# Patient Record
Sex: Female | Born: 1950 | Race: White | Hispanic: No | Marital: Married | Smoking: Never smoker
Health system: Southern US, Community
[De-identification: ages and names within clinical notes are randomized; demographics above are authoritative.]

## PROBLEM LIST (undated history)

## (undated) DIAGNOSIS — R911 Solitary pulmonary nodule: Secondary | ICD-10-CM

## (undated) DIAGNOSIS — E7800 Pure hypercholesterolemia, unspecified: Secondary | ICD-10-CM

## (undated) DIAGNOSIS — T148XXA Other injury of unspecified body region, initial encounter: Secondary | ICD-10-CM

## (undated) DIAGNOSIS — H269 Unspecified cataract: Secondary | ICD-10-CM

## (undated) DIAGNOSIS — M5030 Other cervical disc degeneration, unspecified cervical region: Secondary | ICD-10-CM

## (undated) DIAGNOSIS — Z860100 Personal history of colon polyps, unspecified: Secondary | ICD-10-CM

## (undated) DIAGNOSIS — K2970 Gastritis, unspecified, without bleeding: Secondary | ICD-10-CM

## (undated) DIAGNOSIS — M1990 Unspecified osteoarthritis, unspecified site: Secondary | ICD-10-CM

## (undated) DIAGNOSIS — N816 Rectocele: Secondary | ICD-10-CM

## (undated) DIAGNOSIS — N819 Female genital prolapse, unspecified: Secondary | ICD-10-CM

## (undated) DIAGNOSIS — M5134 Other intervertebral disc degeneration, thoracic region: Secondary | ICD-10-CM

## (undated) DIAGNOSIS — M8580 Other specified disorders of bone density and structure, unspecified site: Secondary | ICD-10-CM

## (undated) DIAGNOSIS — R7303 Prediabetes: Secondary | ICD-10-CM

## (undated) HISTORY — PX: CHOLECYSTECTOMY: SHX55

## (undated) HISTORY — DX: Female genital prolapse, unspecified: N81.9

## (undated) HISTORY — PX: TONSILLECTOMY: SUR1361

## (undated) HISTORY — PX: BIOPSY THYROID: PRO38

## (undated) HISTORY — PX: BREAST SURGERY: SHX581

## (undated) HISTORY — PX: COLON SURGERY: SHX602

## (undated) HISTORY — PX: EYE SURGERY: SHX253

---

## 2015-04-29 DIAGNOSIS — Z1283 Encounter for screening for malignant neoplasm of skin: Secondary | ICD-10-CM | POA: Diagnosis not present

## 2015-05-06 DIAGNOSIS — H43313 Vitreous membranes and strands, bilateral: Secondary | ICD-10-CM | POA: Diagnosis not present

## 2015-05-06 DIAGNOSIS — H5213 Myopia, bilateral: Secondary | ICD-10-CM | POA: Diagnosis not present

## 2015-05-06 DIAGNOSIS — H019 Unspecified inflammation of eyelid: Secondary | ICD-10-CM | POA: Diagnosis not present

## 2015-07-01 DIAGNOSIS — Z1231 Encounter for screening mammogram for malignant neoplasm of breast: Secondary | ICD-10-CM | POA: Diagnosis not present

## 2015-09-20 DIAGNOSIS — Z79899 Other long term (current) drug therapy: Secondary | ICD-10-CM | POA: Diagnosis not present

## 2015-09-20 DIAGNOSIS — Z833 Family history of diabetes mellitus: Secondary | ICD-10-CM | POA: Diagnosis not present

## 2015-09-20 DIAGNOSIS — E782 Mixed hyperlipidemia: Secondary | ICD-10-CM | POA: Diagnosis not present

## 2015-09-25 DIAGNOSIS — R8290 Unspecified abnormal findings in urine: Secondary | ICD-10-CM | POA: Diagnosis not present

## 2015-09-25 DIAGNOSIS — Z79899 Other long term (current) drug therapy: Secondary | ICD-10-CM | POA: Diagnosis not present

## 2015-09-25 DIAGNOSIS — N816 Rectocele: Secondary | ICD-10-CM | POA: Diagnosis not present

## 2015-09-25 DIAGNOSIS — Z23 Encounter for immunization: Secondary | ICD-10-CM | POA: Diagnosis not present

## 2015-09-25 DIAGNOSIS — Z1211 Encounter for screening for malignant neoplasm of colon: Secondary | ICD-10-CM | POA: Diagnosis not present

## 2015-09-25 DIAGNOSIS — Z8739 Personal history of other diseases of the musculoskeletal system and connective tissue: Secondary | ICD-10-CM | POA: Diagnosis not present

## 2015-09-25 DIAGNOSIS — E782 Mixed hyperlipidemia: Secondary | ICD-10-CM | POA: Diagnosis not present

## 2015-09-25 DIAGNOSIS — N819 Female genital prolapse, unspecified: Secondary | ICD-10-CM | POA: Diagnosis not present

## 2015-09-25 DIAGNOSIS — Z0000 Encounter for general adult medical examination without abnormal findings: Secondary | ICD-10-CM | POA: Diagnosis not present

## 2015-10-07 DIAGNOSIS — N9089 Other specified noninflammatory disorders of vulva and perineum: Secondary | ICD-10-CM | POA: Diagnosis not present

## 2015-10-07 DIAGNOSIS — N909 Noninflammatory disorder of vulva and perineum, unspecified: Secondary | ICD-10-CM | POA: Diagnosis not present

## 2015-10-11 DIAGNOSIS — Z1211 Encounter for screening for malignant neoplasm of colon: Secondary | ICD-10-CM | POA: Diagnosis not present

## 2015-10-17 DIAGNOSIS — L308 Other specified dermatitis: Secondary | ICD-10-CM | POA: Diagnosis not present

## 2015-10-17 DIAGNOSIS — L299 Pruritus, unspecified: Secondary | ICD-10-CM | POA: Diagnosis not present

## 2015-12-11 DIAGNOSIS — Z23 Encounter for immunization: Secondary | ICD-10-CM | POA: Diagnosis not present

## 2016-04-13 DIAGNOSIS — N905 Atrophy of vulva: Secondary | ICD-10-CM | POA: Diagnosis not present

## 2016-04-23 DIAGNOSIS — H019 Unspecified inflammation of eyelid: Secondary | ICD-10-CM | POA: Diagnosis not present

## 2016-04-24 DIAGNOSIS — H0014 Chalazion left upper eyelid: Secondary | ICD-10-CM | POA: Diagnosis not present

## 2016-05-15 DIAGNOSIS — H0014 Chalazion left upper eyelid: Secondary | ICD-10-CM | POA: Diagnosis not present

## 2016-06-24 ENCOUNTER — Other Ambulatory Visit: Payer: Self-pay

## 2016-07-06 DIAGNOSIS — Z1231 Encounter for screening mammogram for malignant neoplasm of breast: Secondary | ICD-10-CM | POA: Diagnosis not present

## 2016-08-28 ENCOUNTER — Ambulatory Visit (INDEPENDENT_AMBULATORY_CARE_PROVIDER_SITE_OTHER): Payer: PPO | Admitting: Obstetrics & Gynecology

## 2016-08-28 ENCOUNTER — Encounter: Payer: Self-pay | Admitting: Obstetrics & Gynecology

## 2016-08-28 VITALS — BP 118/82 | Ht 65.0 in | Wt 173.4 lb

## 2016-08-28 DIAGNOSIS — N816 Rectocele: Secondary | ICD-10-CM | POA: Diagnosis not present

## 2016-08-28 DIAGNOSIS — N905 Atrophy of vulva: Secondary | ICD-10-CM | POA: Diagnosis not present

## 2016-08-28 DIAGNOSIS — N8110 Cystocele, unspecified: Secondary | ICD-10-CM | POA: Diagnosis not present

## 2016-08-28 DIAGNOSIS — Z01411 Encounter for gynecological examination (general) (routine) with abnormal findings: Secondary | ICD-10-CM

## 2016-08-28 DIAGNOSIS — N812 Incomplete uterovaginal prolapse: Secondary | ICD-10-CM | POA: Diagnosis not present

## 2016-08-28 DIAGNOSIS — Z780 Asymptomatic menopausal state: Secondary | ICD-10-CM

## 2016-08-28 NOTE — Addendum Note (Signed)
Addended by: Thamas Jaegers on: 08/28/2016 03:22 PM   Modules accepted: Orders

## 2016-08-28 NOTE — Patient Instructions (Signed)
1. Encounter for gynecological examination (general) (routine) with abnormal findings Gyn exam with findings as below.  Pap reflex done.  Breast exam normal.  Mammo neg 06/2016.  Will organize Colono and BD and Fasting labs with Internist 09/2016.  2. Baden-Walker grade 3 rectocele Mildly Sxic.  Surgical correction discussed.  At this time, patient prefers observation.  Pessary declined in the past.  Recommended Neosporin ointment on anterior vulva to decrease exposition discomfort close to the Urethral Meatus.  3. Baden-Walker grade 1 cystocele ASxic  4. First degree uterine prolapse ASxic  5. Menopause present No HRT.  No PMB.  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  Stable.  Previous Vulvar Bx x 2 at same location were benign.  Observing.  Aileene, it was a pleasure to see you today!  I will inform you of your result as soon as available.

## 2016-08-28 NOTE — Progress Notes (Signed)
Cynthia Webster [birth year] [id number]   History:    66 y.o.  G2P2  Married  RP:  Established patient presenting for annual gyn exam   HPI:  Menopause.  No HRT.  No PMB.  No pelvic pain.  Feeling the bulge from her Rectocele at vulva.  A little uncomfortable at anterior vulva because of urethra being exposed.  No UTI Sx.  BMs normal.  Breasts wnl.  Past medical history,surgical history, family history and social history were all reviewed and documented in the EPIC chart.  Gynecologic History No LMP recorded. Contraception: post menopausal status Last Pap:  2015. Results were: normal Last mammogram: 06/2016. Results were: normal Colono: Due now BD: Due now  Obstetric History OB History  Gravida Para Term Preterm AB Living            2  SAB TAB Ectopic Multiple Live Births                    ROS: A ROS was performed and pertinent positives and negatives are included in the history.  GENERAL: No fevers or chills. HEENT: No change in vision, no earache, sore throat or sinus congestion. NECK: No pain or stiffness. CARDIOVASCULAR: No chest pain or pressure. No palpitations. PULMONARY: No shortness of breath, cough or wheeze. GASTROINTESTINAL: No abdominal pain, nausea, vomiting or diarrhea, melena or bright red blood per rectum. GENITOURINARY: No urinary frequency, urgency, hesitancy or dysuria. MUSCULOSKELETAL: No joint or muscle pain, no back pain, no recent trauma. DERMATOLOGIC: No rash, no itching, no lesions. ENDOCRINE: No polyuria, polydipsia, no heat or cold intolerance. No recent change in weight. HEMATOLOGICAL: No anemia or easy bruising or bleeding. NEUROLOGIC: No headache, seizures, numbness, tingling or weakness. PSYCHIATRIC: No depression, no loss of interest in normal activity or change in sleep pattern.     Exam:   BP 118/82   Ht 5\' 5"  (1.651 m)   Wt 173 lb 6.4 oz (78.7 kg)   BMI 28.86 kg/m   Body mass index is 28.86 kg/m.  General appearance : Well developed  well nourished female. No acute distress HEENT: Eyes: no retinal hemorrhage or exudates,  Neck supple, trachea midline, no carotid bruits, no thyroidmegaly Lungs: Clear to auscultation, no rhonchi or wheezes, or rib retractions  Heart: Regular rate and rhythm, no murmurs or gallops Breast:Examined in sitting and supine position were symmetrical in appearance, no palpable masses or tenderness,  no skin retraction, no nipple inversion, no nipple discharge, no skin discoloration, no axillary or supraclavicular lymphadenopathy Abdomen: no palpable masses or tenderness, no rebound or guarding Extremities: no edema or skin discoloration or tenderness  Pelvic:  Vulva:  Small white area, about 1 cm diameter, at ant left vulva inside the labia minora.  Stable x last exam.  2 Vulvar Bxs taken at that location previously.  Patho: benign.  Physical Exam  Genitourinary:       Bartholin, Urethra, Skene Glands: Within normal limits             Vagina: No gross lesions or discharge.  Rectocele Grade 3/3.  Cystocele grade 1/4.  Uterine prolapse grade 1/3.  Cervix: No gross lesions or discharge.  Pap reflex done.  Uterus  AV, normal size, shape and consistency, non-tender and mobile  Adnexa  Without masses or tenderness  Anus and perineum  normal    Assessment/Plan:  66 y.o. female for annual exam   1. Encounter for gynecological examination (general) (routine) with abnormal findings Gyn exam  with findings as below.  Pap reflex done.  Breast exam normal.  Mammo neg 06/2016.  Will organize Colono and BD and Fasting labs with Internist 09/2016.  2. Baden-Walker grade 3 rectocele Mildly Sxic.  Surgical correction discussed.  At this time, patient prefers observation.  Pessary declined in the past.  Recommended Neosporin ointment on anterior vulva to decrease exposition discomfort close to the Urethral Meatus.  3. Baden-Walker grade 1 cystocele ASxic  4. First degree uterine prolapse ASxic  5.  Menopause present No HRT.  No PMB.  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  Stable.  Previous Vulvar Bx x 2 at same location were benign.  Observing.  Counseling >50% x 15 minutes on above issues.  Princess Bruins MD, 2:16 PM 08/28/2016

## 2016-09-01 LAB — PAP IG (IMAGE GUIDED)

## 2016-09-25 DIAGNOSIS — E7800 Pure hypercholesterolemia, unspecified: Secondary | ICD-10-CM | POA: Diagnosis not present

## 2016-09-25 DIAGNOSIS — M8589 Other specified disorders of bone density and structure, multiple sites: Secondary | ICD-10-CM | POA: Diagnosis not present

## 2016-09-25 DIAGNOSIS — G8929 Other chronic pain: Secondary | ICD-10-CM | POA: Diagnosis not present

## 2016-09-25 DIAGNOSIS — Z0000 Encounter for general adult medical examination without abnormal findings: Secondary | ICD-10-CM | POA: Diagnosis not present

## 2016-09-25 DIAGNOSIS — M546 Pain in thoracic spine: Secondary | ICD-10-CM | POA: Diagnosis not present

## 2016-09-25 DIAGNOSIS — M5134 Other intervertebral disc degeneration, thoracic region: Secondary | ICD-10-CM | POA: Diagnosis not present

## 2016-09-25 DIAGNOSIS — Z1211 Encounter for screening for malignant neoplasm of colon: Secondary | ICD-10-CM | POA: Diagnosis not present

## 2016-09-30 ENCOUNTER — Emergency Department
Admission: EM | Admit: 2016-09-30 | Discharge: 2016-09-30 | Disposition: A | Discharge status: Home / Self Care | Payer: PPO | Attending: Emergency Medicine | Admitting: Emergency Medicine

## 2016-09-30 ENCOUNTER — Encounter: Payer: Self-pay | Admitting: Emergency Medicine

## 2016-09-30 ENCOUNTER — Emergency Department: Payer: PPO

## 2016-09-30 DIAGNOSIS — R938 Abnormal findings on diagnostic imaging of other specified body structures: Secondary | ICD-10-CM | POA: Diagnosis present

## 2016-09-30 DIAGNOSIS — J986 Disorders of diaphragm: Secondary | ICD-10-CM | POA: Diagnosis not present

## 2016-09-30 NOTE — Discharge Instructions (Signed)
You have an elevated left hemidiaphragm but otherwise your CXR is unremarkable. As we discussed please follow up with Dr. Candiss Norse

## 2016-09-30 NOTE — ED Triage Notes (Signed)
Patient presents to ED via POV. Patient had a chest xray done on Friday. PCP called her today and told her she needed to come to ED for fuller evaluation. Patient states, "She said my left lower lung was collapsed but it was a gradual thing". Patient denies SOB or difficultly breathing.

## 2016-09-30 NOTE — ED Notes (Signed)
Spoke with Dr. Quentin Cornwall in regards to patients presentation. See orders.

## 2016-09-30 NOTE — ED Provider Notes (Signed)
[facility] Emergency Department Provider Note   ____________________________________________    I have reviewed the triage vital signs and the nursing notes.   HISTORY  Chief Complaint Follow-up     HPI Cynthia Webster is a 66 y.o. female who was sent to the emergency department by PCP for concerning appearance of lung on T-spine films that she had several days ago. There was concern for mediastinal shift. The patient feels well, has no shortness of breath no chest pain no nausea or vomiting or abdominal pain. She saw her results on my chart and called her physician this morning who recommended she come to the ED for evaluation.   Past Medical History:  Diagnosis Date  . Pelvic prolapse     There are no active problems to display for this patient.   History reviewed. No pertinent surgical history.  Prior to Admission medications   Not on File     Allergies Patient has no known allergies.  No family history on file.  Social History Social History  Substance Use Topics  . Smoking status: Never Smoker  . Smokeless tobacco: Never Used  . Alcohol use Yes     Comment: SOCIAL DRINK    Review of Systems  Constitutional: No fever/chills Eyes: No visual changes.  ENT: Chronic neck pain Cardiovascular: Denies chest pain. Respiratory: Denies shortness of breath. Gastrointestinal: No abdominal pain.  No nausea, no vomiting.   Genitourinary: Negative for dysuria. History of prolapse Musculoskeletal: Negative for back pain. Skin: Negative for rash. Neurological: Negative for headaches    ____________________________________________   PHYSICAL EXAM:  VITAL SIGNS: ED Triage Vitals [09/30/16 1226]  Enc Vitals Group     BP (!) 146/79     Pulse Rate 98     Resp 17     Temp 98.7 F (37.1 C)     Temp src      SpO2 99 %     Weight 77.1 kg (170 lb)     Height 1.651 m (5\' 5" )     Head Circumference      Peak Flow      Pain Score      Pain Loc      Pain Edu?      Excl. in [place]?     Constitutional: Alert and oriented. No acute distress. Pleasant and interactive Eyes: Conjunctivae are normal.   Nose: No congestion/rhinnorhea. Mouth/Throat: Mucous membranes are moist.    Cardiovascular: Normal rate, regular rhythm. Grossly normal heart sounds.  Good peripheral circulation. Respiratory: Normal respiratory effort.  No retractions. Lungs CTAB. Gastrointestinal: Soft and nontender. No distention.  No CVA tenderness. Genitourinary: deferred Musculoskeletal: No lower extremity tenderness nor edema.  Warm and well perfused Neurologic:  Normal speech and language. No gross focal neurologic deficits are appreciated.  Skin:  Skin is warm, dry and intact. No rash noted. Psychiatric: Mood and affect are normal. Speech and behavior are normal.  ____________________________________________   LABS (all labs ordered are listed, but only abnormal results are displayed)  Labs Reviewed - No data to display ____________________________________________  EKG  None ____________________________________________  RADIOLOGY  Chest x-ray shows elevation of left hemidiaphragm ____________________________________________   PROCEDURES  Procedure(s) performed: No    Critical Care performed: No ____________________________________________   INITIAL IMPRESSION / ASSESSMENT AND PLAN / ED COURSE  Pertinent labs & imaging results that were available during my care of the patient were reviewed by me and considered in my medical decision making (see chart for  details).  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 Patient is asymptomatic vitals normal and no further workup necessary in the emergency department we'll refer her to her PCP. Patient and her husband agree to this plan    ____________________________________________   FINAL CLINICAL  IMPRESSION(S) / ED DIAGNOSES  Final diagnoses:  Elevated hemidiaphragm      NEW MEDICATIONS STARTED DURING THIS VISIT:  There are no discharge medications for this patient.    Note:  This document was prepared using Dragon voice recognition software and may include unintentional dictation errors.    Lavonia Drafts, MD 09/30/16 [phone]

## 2016-10-07 ENCOUNTER — Other Ambulatory Visit: Payer: Self-pay | Admitting: Internal Medicine

## 2016-10-09 ENCOUNTER — Other Ambulatory Visit: Payer: Self-pay | Admitting: Internal Medicine

## 2016-10-09 DIAGNOSIS — R9389 Abnormal findings on diagnostic imaging of other specified body structures: Secondary | ICD-10-CM

## 2016-10-09 DIAGNOSIS — J986 Disorders of diaphragm: Secondary | ICD-10-CM | POA: Diagnosis not present

## 2016-10-14 ENCOUNTER — Ambulatory Visit
Admission: RE | Admit: 2016-10-14 | Discharge: 2016-10-14 | Disposition: A | Discharge status: Home / Self Care | Payer: PPO | Source: Ambulatory Visit | Attending: Internal Medicine | Admitting: Internal Medicine

## 2016-10-14 DIAGNOSIS — R9389 Abnormal findings on diagnostic imaging of other specified body structures: Secondary | ICD-10-CM

## 2016-10-14 DIAGNOSIS — R911 Solitary pulmonary nodule: Secondary | ICD-10-CM | POA: Insufficient documentation

## 2016-10-14 DIAGNOSIS — J9811 Atelectasis: Secondary | ICD-10-CM | POA: Diagnosis not present

## 2016-10-14 DIAGNOSIS — E041 Nontoxic single thyroid nodule: Secondary | ICD-10-CM | POA: Diagnosis not present

## 2016-10-14 DIAGNOSIS — R938 Abnormal findings on diagnostic imaging of other specified body structures: Secondary | ICD-10-CM | POA: Diagnosis present

## 2016-10-14 DIAGNOSIS — I700 Atherosclerosis of aorta: Secondary | ICD-10-CM | POA: Diagnosis not present

## 2016-10-14 DIAGNOSIS — J986 Disorders of diaphragm: Secondary | ICD-10-CM | POA: Diagnosis not present

## 2016-10-14 MED ORDER — IOPAMIDOL (ISOVUE-300) INJECTION 61%
75.0000 mL | Freq: Once | INTRAVENOUS | Status: AC | PRN
  Administered 2016-10-14: 75 mL via INTRAVENOUS

## 2016-10-22 DIAGNOSIS — R911 Solitary pulmonary nodule: Secondary | ICD-10-CM | POA: Diagnosis not present

## 2016-10-22 DIAGNOSIS — E7800 Pure hypercholesterolemia, unspecified: Secondary | ICD-10-CM | POA: Diagnosis not present

## 2016-10-22 DIAGNOSIS — J986 Disorders of diaphragm: Secondary | ICD-10-CM | POA: Diagnosis not present

## 2016-10-23 DIAGNOSIS — Z1211 Encounter for screening for malignant neoplasm of colon: Secondary | ICD-10-CM | POA: Diagnosis not present

## 2016-11-18 DIAGNOSIS — L603 Nail dystrophy: Secondary | ICD-10-CM | POA: Diagnosis not present

## 2016-11-18 DIAGNOSIS — D214 Benign neoplasm of connective and other soft tissue of abdomen: Secondary | ICD-10-CM | POA: Diagnosis not present

## 2016-11-18 DIAGNOSIS — D216 Benign neoplasm of connective and other soft tissue of trunk, unspecified: Secondary | ICD-10-CM | POA: Diagnosis not present

## 2016-11-18 DIAGNOSIS — D485 Neoplasm of uncertain behavior of skin: Secondary | ICD-10-CM | POA: Diagnosis not present

## 2016-11-18 DIAGNOSIS — D225 Melanocytic nevi of trunk: Secondary | ICD-10-CM | POA: Diagnosis not present

## 2016-11-18 DIAGNOSIS — L821 Other seborrheic keratosis: Secondary | ICD-10-CM | POA: Diagnosis not present

## 2016-12-11 DIAGNOSIS — E042 Nontoxic multinodular goiter: Secondary | ICD-10-CM | POA: Diagnosis not present

## 2017-06-09 DIAGNOSIS — M542 Cervicalgia: Secondary | ICD-10-CM | POA: Diagnosis not present

## 2017-06-09 DIAGNOSIS — M47814 Spondylosis without myelopathy or radiculopathy, thoracic region: Secondary | ICD-10-CM | POA: Diagnosis not present

## 2017-06-09 DIAGNOSIS — M62838 Other muscle spasm: Secondary | ICD-10-CM | POA: Diagnosis not present

## 2017-07-12 ENCOUNTER — Encounter: Payer: Self-pay | Admitting: Obstetrics & Gynecology

## 2017-07-12 DIAGNOSIS — Z1231 Encounter for screening mammogram for malignant neoplasm of breast: Secondary | ICD-10-CM | POA: Diagnosis not present

## 2017-07-28 ENCOUNTER — Other Ambulatory Visit: Payer: Self-pay | Admitting: Internal Medicine

## 2017-07-28 DIAGNOSIS — R911 Solitary pulmonary nodule: Secondary | ICD-10-CM

## 2017-08-05 ENCOUNTER — Ambulatory Visit
Admission: RE | Admit: 2017-08-05 | Discharge: 2017-08-05 | Disposition: A | Discharge status: Home / Self Care | Payer: PPO | Source: Ambulatory Visit | Attending: Internal Medicine | Admitting: Internal Medicine

## 2017-08-05 DIAGNOSIS — R911 Solitary pulmonary nodule: Secondary | ICD-10-CM | POA: Insufficient documentation

## 2017-08-05 DIAGNOSIS — Z1211 Encounter for screening for malignant neoplasm of colon: Secondary | ICD-10-CM | POA: Insufficient documentation

## 2017-08-05 DIAGNOSIS — I700 Atherosclerosis of aorta: Secondary | ICD-10-CM | POA: Diagnosis not present

## 2017-08-12 ENCOUNTER — Ambulatory Visit: Payer: PPO | Admitting: Obstetrics & Gynecology

## 2017-08-12 ENCOUNTER — Encounter: Payer: Self-pay | Admitting: Obstetrics & Gynecology

## 2017-08-12 VITALS — BP 124/70

## 2017-08-12 DIAGNOSIS — N9089 Other specified noninflammatory disorders of vulva and perineum: Secondary | ICD-10-CM | POA: Diagnosis not present

## 2017-08-12 NOTE — Patient Instructions (Signed)
1. Vulvar lesion Vulvar lesion appears stable.  Vulvar biopsy done to rule out VIN.  Not suspicious for vulvar cancer.  Pending vulvar biopsy.  Patient reassured.  Postprocedure recommendations discussed.  Patient will follow-up for an Annual gynecologic exam.  Cynthia Webster, it was a pleasure seeing you today!  I will inform you of your results as soon as they are available.

## 2017-08-12 NOTE — Progress Notes (Signed)
    Cynthia Webster 1951/01/16 [id number]        67 y.o.  G2P2L2 Married  RP: Vulvar white lesion for reevaluation  HPI: Would like me to look at the white vulvar lesion which we have been observing for a few years.  2 previous biopsies in that area were benign.  Patient has no irritation or pain at that level.     OB History  Gravida Para Term Preterm AB Living            2  SAB TAB Ectopic Multiple Live Births               Past medical history,surgical history, problem list, medications, allergies, family history and social history were all reviewed and documented in the EPIC chart.   Directed ROS with pertinent positives and negatives documented in the history of present illness/assessment and plan.  Exam:  Vitals:   08/12/17 1102  BP: 124/70   General appearance:  Normal  Gynecologic exam: Vulva:  Small white patch 1 cm at upper left inner vulva.    Vulvar biopsy:  Verbal informed consent obtained.  Betadine prep, local anesthesia with Lidocaine 1%.  Biopsy with scalpel.  Closure with 3 separate stitches of Vicryl 4-0.  Good hemostasis.  Specimen sent to pathology.   Assessment/Plan:  67 y.o. No obstetric history on file.   1. Vulvar lesion Vulvar lesion appears stable.  Vulvar biopsy done to rule out VIN.  Not suspicious for vulvar cancer.  Pending vulvar biopsy.  Patient reassured.  Postprocedure recommendations discussed.  Patient will follow-up for an Annual gynecologic exam.  Counseling on above issues and coordination of care more than 50% for 15 minutes.  Princess Bruins MD, 11:13 AM 08/12/2017

## 2017-08-16 LAB — TISSUE SPECIMEN

## 2017-08-16 LAB — PATHOLOGY

## 2017-08-18 ENCOUNTER — Telehealth: Payer: Self-pay

## 2017-08-18 NOTE — Telephone Encounter (Signed)
-----   Message from Princess Bruins, MD sent at 08/18/2017  8:44 AM EDT ----- Patho:  Benign, no dysplasia, no malignancy  Patho: Focal papillomatous skin with hyperkeratosis  is noted, suggestive of verrucoid change.  Scattered chronic inflammation is also noted. A  PAS stain with good positive control was  performed and does not demonstrate fungal hyphae.  There is no evidence of dysplasia or malignancy.

## 2017-08-18 NOTE — Telephone Encounter (Signed)
Patient was informed. She had already viewed the result in her My Chart and she wants to know what "verrucoid change" means.

## 2017-08-19 NOTE — Telephone Encounter (Signed)
Spoke with patient and informed her. °

## 2017-08-19 NOTE — Telephone Encounter (Signed)
Verrucoid: Wart-like.

## 2017-09-13 DIAGNOSIS — D225 Melanocytic nevi of trunk: Secondary | ICD-10-CM | POA: Diagnosis not present

## 2017-09-13 DIAGNOSIS — L821 Other seborrheic keratosis: Secondary | ICD-10-CM | POA: Diagnosis not present

## 2017-09-27 DIAGNOSIS — Z8371 Family history of colonic polyps: Secondary | ICD-10-CM | POA: Diagnosis not present

## 2017-09-27 DIAGNOSIS — N816 Rectocele: Secondary | ICD-10-CM | POA: Diagnosis not present

## 2017-09-28 DIAGNOSIS — Z23 Encounter for immunization: Secondary | ICD-10-CM | POA: Diagnosis not present

## 2017-09-28 DIAGNOSIS — E7800 Pure hypercholesterolemia, unspecified: Secondary | ICD-10-CM | POA: Diagnosis not present

## 2017-09-28 DIAGNOSIS — Z1159 Encounter for screening for other viral diseases: Secondary | ICD-10-CM | POA: Diagnosis not present

## 2017-09-28 DIAGNOSIS — Z0000 Encounter for general adult medical examination without abnormal findings: Secondary | ICD-10-CM | POA: Diagnosis not present

## 2017-09-28 DIAGNOSIS — R739 Hyperglycemia, unspecified: Secondary | ICD-10-CM | POA: Diagnosis not present

## 2017-09-30 DIAGNOSIS — M79641 Pain in right hand: Secondary | ICD-10-CM | POA: Diagnosis not present

## 2017-10-05 DIAGNOSIS — W010XXA Fall on same level from slipping, tripping and stumbling without subsequent striking against object, initial encounter: Secondary | ICD-10-CM | POA: Diagnosis not present

## 2017-10-05 DIAGNOSIS — S62326A Displaced fracture of shaft of fifth metacarpal bone, right hand, initial encounter for closed fracture: Secondary | ICD-10-CM | POA: Diagnosis not present

## 2017-10-20 DIAGNOSIS — S62326D Displaced fracture of shaft of fifth metacarpal bone, right hand, subsequent encounter for fracture with routine healing: Secondary | ICD-10-CM | POA: Diagnosis not present

## 2017-11-08 DIAGNOSIS — H2511 Age-related nuclear cataract, right eye: Secondary | ICD-10-CM | POA: Diagnosis not present

## 2017-11-10 DIAGNOSIS — S62326D Displaced fracture of shaft of fifth metacarpal bone, right hand, subsequent encounter for fracture with routine healing: Secondary | ICD-10-CM | POA: Diagnosis not present

## 2017-12-02 ENCOUNTER — Encounter: Payer: PPO | Admitting: Obstetrics & Gynecology

## 2017-12-02 ENCOUNTER — Encounter: Payer: Self-pay | Admitting: *Deleted

## 2017-12-03 ENCOUNTER — Ambulatory Visit
Admission: RE | Admit: 2017-12-03 | Discharge: 2017-12-03 | Disposition: A | Discharge status: Home / Self Care | Payer: PPO | Source: Ambulatory Visit | Attending: Unknown Physician Specialty | Admitting: Unknown Physician Specialty

## 2017-12-03 ENCOUNTER — Ambulatory Visit: Payer: PPO | Admitting: Certified Registered"

## 2017-12-03 ENCOUNTER — Encounter
Admission: RE | Disposition: A | Discharge status: Home / Self Care | Payer: Self-pay | Source: Ambulatory Visit | Attending: Unknown Physician Specialty

## 2017-12-03 DIAGNOSIS — D123 Benign neoplasm of transverse colon: Secondary | ICD-10-CM | POA: Diagnosis not present

## 2017-12-03 DIAGNOSIS — E7800 Pure hypercholesterolemia, unspecified: Secondary | ICD-10-CM | POA: Diagnosis not present

## 2017-12-03 DIAGNOSIS — D126 Benign neoplasm of colon, unspecified: Secondary | ICD-10-CM | POA: Diagnosis not present

## 2017-12-03 DIAGNOSIS — K635 Polyp of colon: Secondary | ICD-10-CM | POA: Diagnosis not present

## 2017-12-03 DIAGNOSIS — Z8371 Family history of colonic polyps: Secondary | ICD-10-CM | POA: Diagnosis not present

## 2017-12-03 DIAGNOSIS — Z79899 Other long term (current) drug therapy: Secondary | ICD-10-CM | POA: Diagnosis not present

## 2017-12-03 DIAGNOSIS — K640 First degree hemorrhoids: Secondary | ICD-10-CM | POA: Diagnosis not present

## 2017-12-03 DIAGNOSIS — Z1211 Encounter for screening for malignant neoplasm of colon: Secondary | ICD-10-CM | POA: Diagnosis not present

## 2017-12-03 HISTORY — PX: COLONOSCOPY WITH PROPOFOL: SHX5780

## 2017-12-03 HISTORY — DX: Pure hypercholesterolemia, unspecified: E78.00

## 2017-12-03 HISTORY — DX: Other specified disorders of bone density and structure, unspecified site: M85.80

## 2017-12-03 HISTORY — DX: Other cervical disc degeneration, unspecified cervical region: M50.30

## 2017-12-03 HISTORY — DX: Solitary pulmonary nodule: R91.1

## 2017-12-03 SURGERY — COLONOSCOPY WITH PROPOFOL
Anesthesia: General

## 2017-12-03 MED ORDER — LIDOCAINE HCL (CARDIAC) PF 100 MG/5ML IV SOSY
PREFILLED_SYRINGE | INTRAVENOUS | Status: DC | PRN
  Administered 2017-12-03: 80 mg via INTRAVENOUS

## 2017-12-03 MED ORDER — PROPOFOL 10 MG/ML IV BOLUS
INTRAVENOUS | Status: AC
  Filled 2017-12-03: qty 40

## 2017-12-03 MED ORDER — PROPOFOL 500 MG/50ML IV EMUL
INTRAVENOUS | Status: DC | PRN
  Administered 2017-12-03: 100 ug/kg/min via INTRAVENOUS

## 2017-12-03 MED ORDER — SODIUM CHLORIDE 0.9 % IV SOLN
INTRAVENOUS | Status: DC
  Administered 2017-12-03: 13:00:00 via INTRAVENOUS

## 2017-12-03 MED ORDER — PROPOFOL 10 MG/ML IV BOLUS
INTRAVENOUS | Status: DC | PRN
  Administered 2017-12-03: 100 mg via INTRAVENOUS

## 2017-12-03 MED ORDER — SODIUM CHLORIDE 0.9 % IV SOLN: INTRAVENOUS | Status: DC

## 2017-12-03 MED ORDER — SODIUM CHLORIDE 0.9 % IV SOLN
INTRAVENOUS | Status: DC
  Administered 2017-12-03: 1000 mL via INTRAVENOUS

## 2017-12-03 NOTE — Anesthesia Post-op Follow-up Note (Signed)
Anesthesia QCDR form completed.        

## 2017-12-03 NOTE — Op Note (Signed)
[facility] Patient Name: Cynthia Webster Procedure Date: 12/03/2017 11:53 AM MRN: [id number] Account #: [id number] Date of Birth: 01/19/1951 Admit Type: Outpatient Age: 67 Room: [facility] ENDO ROOM 1 Gender: Female Note Status: Finalized Procedure:            Colonoscopy Indications:          Screening for colorectal malignant neoplasm Providers:            Manya Silvas, MD Referring MD:         Glendon Axe (Referring MD) Medicines:            Propofol per Anesthesia Complications:        No immediate complications. Procedure:            Pre-Anesthesia Assessment:                       - After reviewing the risks and benefits, the patient                        was deemed in satisfactory condition to undergo the                        procedure.                       After obtaining informed consent, the colonoscope was                        passed under direct vision. Throughout the procedure,                        the patient's blood pressure, pulse, and oxygen                        saturations were monitored continuously. The                        Colonoscope was introduced through the anus and                        advanced to the the cecum, identified by appendiceal                        orifice and ileocecal valve. The patient tolerated the                        procedure well. The quality of the bowel preparation                        was good. Findings:      A large polyp was found in the ascending colon. The polyp was sessile.       It was oblong and on a fold and required repeated piecemeal removal. The       polyp was removed with a hot snare. Resection and retrieval were       complete. To prevent bleeding after the polypectomy, four hemostatic       clips were successfully placed. There was no bleeding during, or at the       end, of the procedure.      A medium polyp was found in the transverse colon. The  polyp was sessile.        The polyp was removed with a hot snare. Resection and retrieval were       complete. 2 clips applied to the site.      Internal hemorrhoids were found during endoscopy. The hemorrhoids were       small and Grade I (internal hemorrhoids that do not prolapse). Impression:           - One large polyp in the ascending colon, removed with                        a hot snare. Resected and retrieved. Clips were placed.                       - One medium polyp in the transverse colon, removed                        with a hot snare. Resected and retrieved.                       - Internal hemorrhoids. Recommendation:       - Await pathology results. Manya Silvas, MD 12/03/2017 1:42:45 PM This report has been signed electronically. Number of Addenda: 0 Note Initiated On: 12/03/2017 11:53 AM Scope Withdrawal Time: 0 hours 33 minutes 46 seconds  Total Procedure Duration: 0 hours 37 minutes 11 seconds       [facility]

## 2017-12-03 NOTE — Anesthesia Postprocedure Evaluation (Signed)
Anesthesia Post Note  Patient: Cynthia Webster  Procedure(s) Performed: COLONOSCOPY WITH PROPOFOL (N/A )  Patient location during evaluation: Endoscopy Anesthesia Type: General Level of consciousness: awake and alert Pain management: pain level controlled Vital Signs Assessment: post-procedure vital signs reviewed and stable Respiratory status: spontaneous breathing, nonlabored ventilation, respiratory function stable and patient connected to nasal cannula oxygen Cardiovascular status: blood pressure returned to baseline and stable Postop Assessment: no apparent nausea or vomiting Anesthetic complications: no     Last Vitals:  Vitals:   12/03/17 1338  BP: 106/71  Pulse: 90  Resp: (!) 21  Temp: (!) 36 C  SpO2: 100%    Last Pain:  Vitals:   12/03/17 1348  TempSrc:   PainSc: 0-No pain                 Selma Rodelo S

## 2017-12-03 NOTE — H&P (Signed)
   Primary Care Physician:  Glendon Axe, MD Primary Gastroenterologist:  Dr. Vira Agar  Pre-Procedure History & Physical: HPI:  Cynthia Webster is a 67 y.o. female is here for a screening colonoscopy.  Her last colonoscopy was over 10 years ago.   Past Medical History:  Diagnosis Date  . DDD (degenerative disc disease), cervical   . Hypercholesteremia   . Osteopenia   . Pelvic prolapse   . Pulmonary nodule     Past Surgical History:  Procedure Laterality Date  . BREAST SURGERY    . CHOLECYSTECTOMY      Prior to Admission medications   Medication Sig Start Date End Date Taking? Authorizing Provider  simvastatin (ZOCOR) 10 MG tablet Take 10 mg by mouth daily.   Yes [provider]    Allergies as of 10/18/2017  . (No Known Allergies)    History reviewed. No pertinent family history.  Social History   Socioeconomic History  . Marital status: Married    Spouse name: Not on file  . Number of children: Not on file  . Years of education: Not on file  . Highest education level: Not on file  Occupational History  . Not on file  Social Needs  . Financial resource strain: Not on file  . Food insecurity:    Worry: Not on file    Inability: Not on file  . Transportation needs:    Medical: Not on file    Non-medical: Not on file  Tobacco Use  . Smoking status: Never Smoker  . Smokeless tobacco: Never Used  Substance and Sexual Activity  . Alcohol use: Yes    Comment: SOCIAL DRINK  . Drug use: No  . Sexual activity: Yes    Birth control/protection: None  Lifestyle  . Physical activity:    Days per week: Not on file    Minutes per session: Not on file  . Stress: Not on file  Relationships  . Social connections:    Talks on phone: Not on file    Gets together: Not on file    Attends religious service: Not on file    Active member of club or organization: Not on file    Attends meetings of clubs or organizations: Not on file    Relationship status: Not  on file  . Intimate partner violence:    Fear of current or ex partner: Not on file    Emotionally abused: Not on file    Physically abused: Not on file    Forced sexual activity: Not on file  Other Topics Concern  . Not on file  Social History Narrative  . Not on file    Review of Systems: See HPI, otherwise negative ROS  Physical Exam: There were no vitals taken for this visit. General:   Alert,  pleasant and cooperative in NAD Head:  Normocephalic and atraumatic. Neck:  Supple; no masses or thyromegaly. Lungs:  Clear throughout to auscultation.    Heart:  Regular rate and rhythm. Abdomen:  Soft, nontender and nondistended. Normal bowel sounds, without guarding, and without rebound.   Neurologic:  Alert and  oriented x4;  grossly normal neurologically.  Impression/Plan: Cynthia Webster is here for an colonoscopy to be performed for screening colonoscopy.  Risks, benefits, limitations, and alternatives regarding  colonoscopy have been reviewed with the patient.  Questions have been answered.  All parties agreeable.   Gaylyn Cheers, MD  12/03/2017, 12:46 PM

## 2017-12-03 NOTE — Anesthesia Preprocedure Evaluation (Signed)
Anesthesia Evaluation  Patient identified by MRN, date of birth, ID band Patient awake    Reviewed: Allergy & Precautions, H&P , NPO status , Patient's Chart, lab work & pertinent test results, reviewed documented beta blocker date and time   Airway Mallampati: II   Neck ROM: full    Dental  (+) Teeth Intact   Pulmonary neg pulmonary ROS,    Pulmonary exam normal        Cardiovascular negative cardio ROS Normal cardiovascular exam Rhythm:regular Rate:Normal     Neuro/Psych negative neurological ROS  negative psych ROS   GI/Hepatic negative GI ROS, Neg liver ROS,   Endo/Other  negative endocrine ROS  Renal/GU negative Renal ROS  negative genitourinary   Musculoskeletal   Abdominal   Peds  Hematology negative hematology ROS (+)   Anesthesia Other Findings Past Medical History: 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), cervical No date: Hypercholesteremia No date: Osteopenia No date: Pelvic prolapse No date: Pulmonary nodule Past Surgical History: No date: BREAST SURGERY No date: CHOLECYSTECTOMY   Reproductive/Obstetrics negative OB ROS                             Anesthesia Physical Anesthesia Plan  ASA: II  Anesthesia Plan: General   Post-op Pain Management:    Induction:   PONV Risk Score and Plan:   Airway Management Planned:   Additional Equipment:   Intra-op Plan:   Post-operative Plan:   Informed Consent: I have reviewed the patients History and Physical, chart, labs and discussed the procedure including the risks, benefits and alternatives for the proposed anesthesia with the patient or authorized representative who has indicated his/her understanding and acceptance.   Dental Advisory Given  Plan Discussed with: CRNA  Anesthesia Plan Comments:         Anesthesia Quick Evaluation

## 2017-12-03 NOTE — Transfer of Care (Signed)
Immediate Anesthesia Transfer of Care Note  Patient: Cynthia Webster  Procedure(s) Performed: COLONOSCOPY WITH PROPOFOL (N/A )  Patient Location: PACU  Anesthesia Type:General  Level of Consciousness: awake, alert  and oriented  Airway & Oxygen Therapy: Patient Spontanous Breathing and Patient connected to nasal cannula oxygen  Post-op Assessment: Report given to RN  Post vital signs: Reviewed and stable  Last Vitals:  Vitals Value Taken Time  BP    Temp 36 C 12/03/2017  1:38 PM  Pulse 88 12/03/2017  1:38 PM  Resp 17 12/03/2017  1:38 PM  SpO2 99 % 12/03/2017  1:38 PM  Vitals shown include unvalidated device data.  Last Pain:  Vitals:   12/03/17 1338  TempSrc: Tympanic         Complications: No apparent anesthesia complications

## 2017-12-05 ENCOUNTER — Encounter: Payer: Self-pay | Admitting: Unknown Physician Specialty

## 2017-12-07 LAB — SURGICAL PATHOLOGY

## 2017-12-08 DIAGNOSIS — E042 Nontoxic multinodular goiter: Secondary | ICD-10-CM | POA: Diagnosis not present

## 2018-01-10 ENCOUNTER — Ambulatory Visit (INDEPENDENT_AMBULATORY_CARE_PROVIDER_SITE_OTHER): Payer: PPO | Admitting: Obstetrics & Gynecology

## 2018-01-10 ENCOUNTER — Encounter: Payer: Self-pay | Admitting: Obstetrics & Gynecology

## 2018-01-10 VITALS — BP 120/78 | Ht 65.5 in | Wt 174.0 lb

## 2018-01-10 DIAGNOSIS — Z780 Asymptomatic menopausal state: Secondary | ICD-10-CM

## 2018-01-10 DIAGNOSIS — Z01419 Encounter for gynecological examination (general) (routine) without abnormal findings: Secondary | ICD-10-CM

## 2018-01-10 DIAGNOSIS — N816 Rectocele: Secondary | ICD-10-CM

## 2018-01-10 DIAGNOSIS — M8580 Other specified disorders of bone density and structure, unspecified site: Secondary | ICD-10-CM

## 2018-01-10 NOTE — Progress Notes (Signed)
Cynthia Webster 1950/09/19 [id number]   History:    67 y.o. G2P2L2 Married  RP:  Established patient presenting for annual gyn exam   HPI: Menopause, well on no hormone replacement therapy.  No postmenopausal bleeding.  No pelvic pain.  Rectocele symptoms unchanged.  No intercourse.  Left anterior/inner vulvar biopsy 07/2017 was benign.  Urine and bowel movements normal.  Breasts normal.  Body mass index 28.51.  Health labs with family physician.  Past medical history,surgical history, family history and social history were all reviewed and documented in the EPIC chart.  Gynecologic History No LMP recorded. Patient is postmenopausal. Contraception: post menopausal status Last Pap: 08/2016. Results were: Negative Last mammogram: 06/2017. Results were: Benign Bone Density: Many yrs ago Osteopenia treated with Fosamax.  Declines BD at this time. Colonoscopy: 2019  Obstetric History OB History  Gravida Para Term Preterm AB Living            2  SAB TAB Ectopic Multiple Live Births                ROS: A ROS was performed and pertinent positives and negatives are included in the history.  GENERAL: No fevers or chills. HEENT: No change in vision, no earache, sore throat or sinus congestion. NECK: No pain or stiffness. CARDIOVASCULAR: No chest pain or pressure. No palpitations. PULMONARY: No shortness of breath, cough or wheeze. GASTROINTESTINAL: No abdominal pain, nausea, vomiting or diarrhea, melena or bright red blood per rectum. GENITOURINARY: No urinary frequency, urgency, hesitancy or dysuria. MUSCULOSKELETAL: No joint or muscle pain, no back pain, no recent trauma. DERMATOLOGIC: No rash, no itching, no lesions. ENDOCRINE: No polyuria, polydipsia, no heat or cold intolerance. No recent change in weight. HEMATOLOGICAL: No anemia or easy bruising or bleeding. NEUROLOGIC: No headache, seizures, numbness, tingling or weakness. PSYCHIATRIC: No depression, no loss of interest in normal activity  or change in sleep pattern.     Exam:   BP 120/78   Ht 5' 5.5" (1.664 m)   Wt 174 lb (78.9 kg)   BMI 28.51 kg/m   Body mass index is 28.51 kg/m.  General appearance : Well developed well nourished female. No acute distress HEENT: Eyes: no retinal hemorrhage or exudates,  Neck supple, trachea midline, no carotid bruits, no thyroidmegaly Lungs: Clear to auscultation, no rhonchi or wheezes, or rib retractions  Heart: Regular rate and rhythm, no murmurs or gallops Breast:Examined in sitting and supine position were symmetrical in appearance, no palpable masses or tenderness,  no skin retraction, no nipple inversion, no nipple discharge, no skin discoloration, no axillary or supraclavicular lymphadenopathy Abdomen: no palpable masses or tenderness, no rebound or guarding Extremities: no edema or skin discoloration or tenderness  Pelvic: Vulva: Normal.  Small scar from previous biopsy at Left anterior/inner vulva (Bx 07/2017 Benign)             Vagina: No gross lesions or discharge  Cervix: No gross lesions or discharge  Uterus  AV, normal size, shape and consistency, non-tender and mobile  Adnexa  Without masses or tenderness  Anus: Normal   Assessment/Plan:  67 y.o. female for annual exam   1. Well female exam with routine gynecological exam Gynecologic exam in menopause with rectocele.  Pap test negative 08/2016.  No indication to repeat a Pap test this year.  Breast normal.  Mammogram benign in April 2019.  Colonoscopy 2019.  Health labs with family physician.  2. Postmenopausal Well on no HRT.  No postmenopausal bleeding.  3. Osteopenia, unspecified location Declines Bone Density at this time.  History of osteopenia on Fosamax per patient.  Will take vitamin D supplements, calcium intake 1.5 g/day and regular weightbearing physical activities.  Recommend a bone density within a year.  Risks of fractures associated with osteoporosis discussed.  4. Rectocele Feeling a bulge.   Not further discussed this visit.  Observing.  Princess Bruins MD, 2:39 PM 01/10/2018

## 2018-01-10 NOTE — Patient Instructions (Signed)
1. Well female exam with routine gynecological exam Gynecologic exam in menopause with rectocele.  Pap test negative 08/2016.  No indication to repeat a Pap test this year.  Breast normal.  Mammogram benign in April 2019.  Colonoscopy 2019.  Health labs with family physician.  2. Postmenopausal Well on no HRT.  No postmenopausal bleeding.  3. Osteopenia, unspecified location Declines Bone Density at this time.  History of osteopenia on Fosamax per patient.  Will take vitamin D supplements, calcium intake 1.5 g/day and regular weightbearing physical activities.  Recommend a bone density within a year.  Risks of fractures associated with osteoporosis discussed.  4. Rectocele Feeling a bulge.  Not further discussed this visit.  Observing.  Cynthia Webster, it was a pleasure seeing you today!

## 2018-05-19 DIAGNOSIS — H2511 Age-related nuclear cataract, right eye: Secondary | ICD-10-CM | POA: Diagnosis not present

## 2018-06-14 ENCOUNTER — Ambulatory Visit: Admit: 2018-06-14 | Payer: PPO | Admitting: Ophthalmology

## 2018-06-14 SURGERY — PHACOEMULSIFICATION, CATARACT, WITH IOL INSERTION
Anesthesia: Choice | Laterality: Right

## 2018-07-21 ENCOUNTER — Other Ambulatory Visit: Payer: Self-pay | Admitting: Obstetrics & Gynecology

## 2018-07-21 DIAGNOSIS — Z1231 Encounter for screening mammogram for malignant neoplasm of breast: Secondary | ICD-10-CM

## 2018-07-28 DIAGNOSIS — Z1231 Encounter for screening mammogram for malignant neoplasm of breast: Secondary | ICD-10-CM | POA: Diagnosis not present

## 2018-08-15 DIAGNOSIS — E7800 Pure hypercholesterolemia, unspecified: Secondary | ICD-10-CM | POA: Diagnosis not present

## 2018-08-15 DIAGNOSIS — H2511 Age-related nuclear cataract, right eye: Secondary | ICD-10-CM | POA: Diagnosis not present

## 2018-08-17 ENCOUNTER — Encounter: Payer: Self-pay | Admitting: *Deleted

## 2018-08-17 ENCOUNTER — Other Ambulatory Visit: Payer: Self-pay

## 2018-08-19 ENCOUNTER — Other Ambulatory Visit
Admission: RE | Admit: 2018-08-19 | Discharge: 2018-08-19 | Disposition: A | Discharge status: Home / Self Care | Payer: PPO | Source: Ambulatory Visit | Attending: Ophthalmology | Admitting: Ophthalmology

## 2018-08-19 ENCOUNTER — Other Ambulatory Visit: Payer: Self-pay

## 2018-08-19 DIAGNOSIS — Z1159 Encounter for screening for other viral diseases: Secondary | ICD-10-CM | POA: Insufficient documentation

## 2018-08-19 DIAGNOSIS — Z01812 Encounter for preprocedural laboratory examination: Secondary | ICD-10-CM | POA: Diagnosis not present

## 2018-08-20 LAB — NOVEL CORONAVIRUS, NAA (HOSP ORDER, SEND-OUT TO REF LAB; TAT 18-24 HRS): SARS-CoV-2, NAA: NOT DETECTED

## 2018-08-22 NOTE — Discharge Instructions (Signed)

## 2018-08-23 ENCOUNTER — Encounter
Admission: RE | Disposition: A | Discharge status: Home / Self Care | Payer: Self-pay | Source: Home / Self Care | Attending: Ophthalmology

## 2018-08-23 ENCOUNTER — Ambulatory Visit: Payer: PPO | Admitting: Anesthesiology

## 2018-08-23 ENCOUNTER — Ambulatory Visit
Admission: RE | Admit: 2018-08-23 | Discharge: 2018-08-23 | Disposition: A | Discharge status: Home / Self Care | Payer: PPO | Attending: Ophthalmology | Admitting: Ophthalmology

## 2018-08-23 DIAGNOSIS — E785 Hyperlipidemia, unspecified: Secondary | ICD-10-CM | POA: Insufficient documentation

## 2018-08-23 DIAGNOSIS — E7800 Pure hypercholesterolemia, unspecified: Secondary | ICD-10-CM | POA: Insufficient documentation

## 2018-08-23 DIAGNOSIS — H25811 Combined forms of age-related cataract, right eye: Secondary | ICD-10-CM | POA: Diagnosis not present

## 2018-08-23 DIAGNOSIS — H2511 Age-related nuclear cataract, right eye: Secondary | ICD-10-CM | POA: Diagnosis not present

## 2018-08-23 DIAGNOSIS — Z79899 Other long term (current) drug therapy: Secondary | ICD-10-CM | POA: Diagnosis not present

## 2018-08-23 HISTORY — PX: CATARACT EXTRACTION W/PHACO: SHX586

## 2018-08-23 HISTORY — DX: Gastritis, unspecified, without bleeding: K29.70

## 2018-08-23 SURGERY — PHACOEMULSIFICATION, CATARACT, WITH IOL INSERTION
Anesthesia: Monitor Anesthesia Care | Site: Eye | Laterality: Right

## 2018-08-23 MED ORDER — EPINEPHRINE PF 1 MG/ML IJ SOLN
INTRAOCULAR | Status: DC | PRN
  Administered 2018-08-23: 75 mL via OPHTHALMIC

## 2018-08-23 MED ORDER — NA CHONDROIT SULF-NA HYALURON 40-17 MG/ML IO SOLN
INTRAOCULAR | Status: DC | PRN
  Administered 2018-08-23: 1 mL via INTRAOCULAR

## 2018-08-23 MED ORDER — MOXIFLOXACIN HCL 0.5 % OP SOLN
OPHTHALMIC | Status: DC | PRN
  Administered 2018-08-23: 0.2 mL via OPHTHALMIC

## 2018-08-23 MED ORDER — LIDOCAINE HCL (PF) 2 % IJ SOLN
INTRAOCULAR | Status: DC | PRN
  Administered 2018-08-23: 1 mL

## 2018-08-23 MED ORDER — FENTANYL CITRATE (PF) 100 MCG/2ML IJ SOLN
INTRAMUSCULAR | Status: DC | PRN
  Administered 2018-08-23: 50 ug via INTRAVENOUS

## 2018-08-23 MED ORDER — BRIMONIDINE TARTRATE-TIMOLOL 0.2-0.5 % OP SOLN
OPHTHALMIC | Status: DC | PRN
  Administered 2018-08-23: 1 [drp] via OPHTHALMIC

## 2018-08-23 MED ORDER — LACTATED RINGERS IV SOLN: 10.0000 mL/h | INTRAVENOUS | Status: DC

## 2018-08-23 MED ORDER — MIDAZOLAM HCL 2 MG/2ML IJ SOLN
INTRAMUSCULAR | Status: DC | PRN
  Administered 2018-08-23: 2 mg via INTRAVENOUS

## 2018-08-23 MED ORDER — TETRACAINE HCL 0.5 % OP SOLN
1.0000 [drp] | OPHTHALMIC | Status: DC | PRN
  Administered 2018-08-23 (×3): 1 [drp] via OPHTHALMIC

## 2018-08-23 MED ORDER — ARMC OPHTHALMIC DILATING DROPS
1.0000 "application " | OPHTHALMIC | Status: DC | PRN
  Administered 2018-08-23 (×3): 1 via OPHTHALMIC

## 2018-08-23 SURGICAL SUPPLY — 19 items

## 2018-08-23 NOTE — H&P (Signed)
All labs reviewed. Abnormal studies sent to patients PCP when indicated.  Previous H&P reviewed, patient examined, there are NO CHANGES.  Cynthia Hohn Porfilio6/9/20208:54 AM

## 2018-08-23 NOTE — Op Note (Signed)
PREOPERATIVE DIAGNOSIS:  Nuclear sclerotic cataract of the right eye.   POSTOPERATIVE DIAGNOSIS:  H25.11  CATARACT   OPERATIVE PROCEDURE: Procedure(s): CATARACT EXTRACTION PHACO AND INTRAOCULAR LENS PLACEMENT (IOC)  RIGHT   SURGEON:  Birder Robson, MD.   ANESTHESIA:  Anesthesiologist: Marice Potter, MD CRNA: Cameron Ali, CRNA  1.      Managed anesthesia care. 2.      0.35ml of Shugarcaine was instilled in the eye following the paracentesis.   COMPLICATIONS:  None.   TECHNIQUE:   Stop and chop   DESCRIPTION OF PROCEDURE:  The patient was examined and consented in the preoperative holding area where the aforementioned topical anesthesia was applied to the right eye and then brought back to the Operating Room where the right eye was prepped and draped in the usual sterile ophthalmic fashion and a lid speculum was placed. A paracentesis was created with the side port blade and the anterior chamber was filled with viscoelastic. A near clear corneal incision was performed with the steel keratome. A continuous curvilinear capsulorrhexis was performed with a cystotome followed by the capsulorrhexis forceps. Hydrodissection and hydrodelineation were carried out with BSS on a blunt cannula. The lens was removed in a stop and chop  technique and the remaining cortical material was removed with the irrigation-aspiration handpiece. The capsular bag was inflated with viscoelastic and the Technis [id number]  lens was placed in the capsular bag without complication. The remaining viscoelastic was removed from the eye with the irrigation-aspiration handpiece. The wounds were hydrated. The anterior chamber was flushed with BSS and the eye was inflated to physiologic pressure. 0.88ml of Vigamox was placed in the anterior chamber. The wounds were found to be water tight. The eye was dressed with Combigan. The patient was given protective glasses to wear throughout the day and a shield with which to sleep  tonight. The patient was also given drops with which to begin a drop regimen today and will follow-up with me in one day. Implant Name Type Inv. Item Serial No. Manufacturer Lot No. LRB No. Used  LENS IOL ACRYSOF IQ 19.5 - [id number] Intraocular Lens LENS IOL ACRYSOF IQ 19.5 [id number] ALCON  Right 1   Procedure(s): CATARACT EXTRACTION PHACO AND INTRAOCULAR LENS PLACEMENT (IOC)  RIGHT (Right)  Electronically signed: Birder Robson 08/23/2018 9:22 AM

## 2018-08-23 NOTE — Transfer of Care (Signed)
Immediate Anesthesia Transfer of Care Note  Patient: Cynthia Webster  Procedure(s) Performed: CATARACT EXTRACTION PHACO AND INTRAOCULAR LENS PLACEMENT (IOC)  RIGHT (Right Eye)  Patient Location: PACU  Anesthesia Type: MAC  Level of Consciousness: awake, alert  and patient cooperative  Airway and Oxygen Therapy: Patient Spontanous Breathing and Patient connected to supplemental oxygen  Post-op Assessment: Post-op Vital signs reviewed, Patient's Cardiovascular Status Stable, Respiratory Function Stable, Patent Airway and No signs of Nausea or vomiting  Post-op Vital Signs: Reviewed and stable  Complications: No apparent anesthesia complications

## 2018-08-23 NOTE — Anesthesia Postprocedure Evaluation (Signed)
Anesthesia Post Note  Patient: Cynthia Webster  Procedure(s) Performed: CATARACT EXTRACTION PHACO AND INTRAOCULAR LENS PLACEMENT (IOC)  RIGHT (Right Eye)  Patient location during evaluation: PACU Anesthesia Type: MAC Level of consciousness: awake and alert Pain management: pain level controlled Vital Signs Assessment: post-procedure vital signs reviewed and stable Respiratory status: spontaneous breathing, nonlabored ventilation, respiratory function stable and patient connected to nasal cannula oxygen Cardiovascular status: stable and blood pressure returned to baseline Postop Assessment: no apparent nausea or vomiting Anesthetic complications: no    SCOURAS, NICOLE ELAINE

## 2018-08-23 NOTE — Anesthesia Preprocedure Evaluation (Signed)
Anesthesia Evaluation  Patient identified by MRN, date of birth, ID band Patient awake    Reviewed: Allergy & Precautions, H&P , NPO status , Patient's Chart, lab work & pertinent test results, reviewed documented beta blocker date and time   Airway Mallampati: II  TM Distance: >3 FB Neck ROM: full    Dental no notable dental hx.    Pulmonary neg pulmonary ROS,    Pulmonary exam normal breath sounds clear to auscultation       Cardiovascular Exercise Tolerance: Good  Rhythm:regular Rate:Normal  Hyperlipidemia   Neuro/Psych negative neurological ROS  negative psych ROS   GI/Hepatic negative GI ROS, Neg liver ROS,   Endo/Other  negative endocrine ROS  Renal/GU negative Renal ROS  negative genitourinary   Musculoskeletal   Abdominal   Peds  Hematology negative hematology ROS (+)   Anesthesia Other Findings   Reproductive/Obstetrics negative OB ROS                             Anesthesia Physical Anesthesia Plan  ASA: II  Anesthesia Plan: MAC   Post-op Pain Management:    Induction:   PONV Risk Score and Plan:   Airway Management Planned:   Additional Equipment:   Intra-op Plan:   Post-operative Plan:   Informed Consent: I have reviewed the patients History and Physical, chart, labs and discussed the procedure including the risks, benefits and alternatives for the proposed anesthesia with the patient or authorized representative who has indicated his/her understanding and acceptance.     Dental Advisory Given  Plan Discussed with: CRNA  Anesthesia Plan Comments:         Anesthesia Quick Evaluation

## 2018-08-23 NOTE — Anesthesia Procedure Notes (Signed)
Procedure Name: MAC Performed by: Takeia Ciaravino, CRNA Pre-anesthesia Checklist: Patient identified, Emergency Drugs available, Suction available, Timeout performed and Patient being monitored Patient Re-evaluated:Patient Re-evaluated prior to induction Oxygen Delivery Method: Nasal cannula Placement Confirmation: positive ETCO2       

## 2018-08-24 ENCOUNTER — Encounter: Payer: Self-pay | Admitting: Ophthalmology

## 2018-10-04 DIAGNOSIS — Z961 Presence of intraocular lens: Secondary | ICD-10-CM | POA: Diagnosis not present

## 2018-10-06 ENCOUNTER — Other Ambulatory Visit: Payer: Self-pay | Admitting: Physician Assistant

## 2018-10-06 DIAGNOSIS — E782 Mixed hyperlipidemia: Secondary | ICD-10-CM | POA: Diagnosis not present

## 2018-10-06 DIAGNOSIS — R911 Solitary pulmonary nodule: Secondary | ICD-10-CM

## 2018-10-06 DIAGNOSIS — E042 Nontoxic multinodular goiter: Secondary | ICD-10-CM | POA: Diagnosis not present

## 2018-10-06 DIAGNOSIS — R7303 Prediabetes: Secondary | ICD-10-CM | POA: Diagnosis not present

## 2018-10-06 DIAGNOSIS — Z0000 Encounter for general adult medical examination without abnormal findings: Secondary | ICD-10-CM | POA: Diagnosis not present

## 2018-10-12 ENCOUNTER — Other Ambulatory Visit: Payer: Self-pay

## 2018-10-12 ENCOUNTER — Ambulatory Visit
Admission: RE | Admit: 2018-10-12 | Discharge: 2018-10-12 | Disposition: A | Discharge status: Home / Self Care | Payer: PPO | Source: Ambulatory Visit | Attending: Physician Assistant | Admitting: Physician Assistant

## 2018-10-12 DIAGNOSIS — R911 Solitary pulmonary nodule: Secondary | ICD-10-CM | POA: Insufficient documentation

## 2018-10-12 DIAGNOSIS — J439 Emphysema, unspecified: Secondary | ICD-10-CM | POA: Diagnosis not present

## 2018-10-27 DIAGNOSIS — Q791 Other congenital malformations of diaphragm: Secondary | ICD-10-CM | POA: Diagnosis not present

## 2018-11-30 DIAGNOSIS — Z86010 Personal history of colonic polyps: Secondary | ICD-10-CM | POA: Diagnosis not present

## 2018-12-13 DIAGNOSIS — E042 Nontoxic multinodular goiter: Secondary | ICD-10-CM | POA: Diagnosis not present

## 2019-01-16 ENCOUNTER — Other Ambulatory Visit: Payer: Self-pay

## 2019-01-17 ENCOUNTER — Encounter: Payer: Self-pay | Admitting: Obstetrics & Gynecology

## 2019-01-17 ENCOUNTER — Ambulatory Visit (INDEPENDENT_AMBULATORY_CARE_PROVIDER_SITE_OTHER): Payer: PPO | Admitting: Obstetrics & Gynecology

## 2019-01-17 VITALS — BP 132/82 | Ht 65.25 in | Wt 167.0 lb

## 2019-01-17 DIAGNOSIS — Z13820 Encounter for screening for osteoporosis: Secondary | ICD-10-CM

## 2019-01-17 DIAGNOSIS — Z780 Asymptomatic menopausal state: Secondary | ICD-10-CM

## 2019-01-17 DIAGNOSIS — N816 Rectocele: Secondary | ICD-10-CM

## 2019-01-17 DIAGNOSIS — Z01419 Encounter for gynecological examination (general) (routine) without abnormal findings: Secondary | ICD-10-CM

## 2019-01-17 NOTE — Progress Notes (Signed)
Cynthia Webster Jul 25, 1950 [id number]   History:    68 y.o. G2P2L2 Married  RP:  Established patient presenting for annual gyn exam   HPI: Menopause, well on no hormone replacement therapy.  No postmenopausal bleeding.  No pelvic pain.  Rectocele symptoms unchanged.  No intercourse.  Left anterior/inner vulvar biopsy 07/2017 was benign.  Urine and bowel movements normal.  Breasts normal.  Body mass index 27.58.  Health labs with family physician.   Past medical history,surgical history, family history and social history were all reviewed and documented in the EPIC chart.  Gynecologic History No LMP recorded. Patient is postmenopausal. Contraception: post menopausal status Last Pap: 08/2016. Results were: Negative Last mammogram: 07/2018. Results were: normal per patient, will obtain report Bone Density: Many yrs ago BD showing Osteopenia, treated with Fosamax.  Will repeat BD now. Colonoscopy: 11/2017  Obstetric History OB History  Gravida Para Term Preterm AB Living  2         2  SAB TAB Ectopic Multiple Live Births               # Outcome Date GA Lbr Len/2nd Weight Sex Delivery Anes PTL Lv  2 Gravida           1 Gravida              ROS: A ROS was performed and pertinent positives and negatives are included in the history.  GENERAL: No fevers or chills. HEENT: No change in vision, no earache, sore throat or sinus congestion. NECK: No pain or stiffness. CARDIOVASCULAR: No chest pain or pressure. No palpitations. PULMONARY: No shortness of breath, cough or wheeze. GASTROINTESTINAL: No abdominal pain, nausea, vomiting or diarrhea, melena or bright red blood per rectum. GENITOURINARY: No urinary frequency, urgency, hesitancy or dysuria. MUSCULOSKELETAL: No joint or muscle pain, no back pain, no recent trauma. DERMATOLOGIC: No rash, no itching, no lesions. ENDOCRINE: No polyuria, polydipsia, no heat or cold intolerance. No recent change in weight. HEMATOLOGICAL: No anemia or easy  bruising or bleeding. NEUROLOGIC: No headache, seizures, numbness, tingling or weakness. PSYCHIATRIC: No depression, no loss of interest in normal activity or change in sleep pattern.     Exam:   BP 132/82   Ht 5' 5.25" (1.657 m)   Wt 167 lb (75.8 kg)   BMI 27.58 kg/m   Body mass index is 27.58 kg/m.  General appearance : Well developed well nourished female. No acute distress HEENT: Eyes: no retinal hemorrhage or exudates,  Neck supple, trachea midline, no carotid bruits, no thyroidmegaly Lungs: Clear to auscultation, no rhonchi or wheezes, or rib retractions  Heart: Regular rate and rhythm, no murmurs or gallops Breast:Examined in sitting and supine position were symmetrical in appearance, no palpable masses or tenderness,  no skin retraction, no nipple inversion, no nipple discharge, no skin discoloration, no axillary or supraclavicular lymphadenopathy Abdomen: no palpable masses or tenderness, no rebound or guarding Extremities: no edema or skin discoloration or tenderness  Pelvic: Vulva: Normal, no lesion seen.             Vagina: No gross lesions or discharge.  Rectocele grade 2/3 with valsalva.  Cervix: No gross lesions or discharge  Uterus  AV, normal size, shape and consistency, non-tender and mobile  Adnexa  Without masses or tenderness  Anus: Normal   Assessment/Plan:  68 y.o. female for annual exam   1. Well female exam with routine gynecological exam Normal gynecologic exam.  Pap test in June 2018  was negative, no indication to repeat this year.  Breast exam normal.  Screening mammogram May 2020 was normal per patient, will obtain report.  Colonoscopy September 2019.  Health labs with family physician.  Good body mass index at 27.58.  Continue with fitness and healthy nutrition.  2. Postmenopausal Well on no hormone replacement therapy.  No postmenopausal bleeding.  3. Rectocele Stable rectocele grade 2/3 with minimal symptoms.  Precautions discussed.  We will  continue to observe.  4. Screening for osteoporosis History of osteopenia on a bone density many years ago.  We will repeat a bone density now.  Vitamin D supplements, calcium intake of 1200 mg daily and regular weightbearing physical activity is recommended. - DG Bone Density; Future  Princess Bruins MD, 2:37 PM 01/17/2019

## 2019-01-22 ENCOUNTER — Encounter: Payer: Self-pay | Admitting: Obstetrics & Gynecology

## 2019-01-22 NOTE — Patient Instructions (Signed)
1. Well female exam with routine gynecological exam Normal gynecologic exam.  Pap test in June 2018 was negative, no indication to repeat this year.  Breast exam normal.  Screening mammogram May 2020 was normal per patient, will obtain report.  Colonoscopy September 2019.  Health labs with family physician.  Good body mass index at 27.58.  Continue with fitness and healthy nutrition.  2. Postmenopausal Well on no hormone replacement therapy.  No postmenopausal bleeding.  3. Rectocele Stable rectocele grade 2/3 with minimal symptoms.  Precautions discussed.  We will continue to observe.  4. Screening for osteoporosis History of osteopenia on a bone density many years ago.  We will repeat a bone density now.  Vitamin D supplements, calcium intake of 1200 mg daily and regular weightbearing physical activity is recommended. - DG Bone Density; Future  Cynthia Webster, it was a pleasure seeing you today!

## 2019-02-08 ENCOUNTER — Ambulatory Visit: Admit: 2019-02-08 | Payer: PPO | Admitting: Internal Medicine

## 2019-02-08 SURGERY — COLONOSCOPY WITH PROPOFOL
Anesthesia: General

## 2019-02-14 ENCOUNTER — Other Ambulatory Visit
Admission: RE | Admit: 2019-02-14 | Discharge: 2019-02-14 | Disposition: A | Discharge status: Home / Self Care | Payer: PPO | Source: Ambulatory Visit | Attending: General Surgery | Admitting: General Surgery

## 2019-02-14 DIAGNOSIS — Z20828 Contact with and (suspected) exposure to other viral communicable diseases: Secondary | ICD-10-CM | POA: Insufficient documentation

## 2019-02-14 DIAGNOSIS — Z01812 Encounter for preprocedural laboratory examination: Secondary | ICD-10-CM | POA: Insufficient documentation

## 2019-02-14 LAB — SARS CORONAVIRUS 2 (TAT 6-24 HRS): SARS Coronavirus 2: NEGATIVE

## 2019-02-16 ENCOUNTER — Encounter: Payer: Self-pay | Admitting: *Deleted

## 2019-02-17 ENCOUNTER — Ambulatory Visit: Payer: PPO | Admitting: Certified Registered Nurse Anesthetist

## 2019-02-17 ENCOUNTER — Encounter
Admission: RE | Disposition: A | Discharge status: Home / Self Care | Payer: Self-pay | Source: Home / Self Care | Attending: General Surgery

## 2019-02-17 ENCOUNTER — Ambulatory Visit
Admission: RE | Admit: 2019-02-17 | Discharge: 2019-02-17 | Disposition: A | Discharge status: Home / Self Care | Payer: PPO | Attending: General Surgery | Admitting: General Surgery

## 2019-02-17 ENCOUNTER — Other Ambulatory Visit: Payer: Self-pay

## 2019-02-17 ENCOUNTER — Encounter: Payer: Self-pay | Admitting: Anesthesiology

## 2019-02-17 DIAGNOSIS — M8580 Other specified disorders of bone density and structure, unspecified site: Secondary | ICD-10-CM | POA: Insufficient documentation

## 2019-02-17 DIAGNOSIS — K635 Polyp of colon: Secondary | ICD-10-CM | POA: Diagnosis not present

## 2019-02-17 DIAGNOSIS — K621 Rectal polyp: Secondary | ICD-10-CM | POA: Diagnosis not present

## 2019-02-17 DIAGNOSIS — E7800 Pure hypercholesterolemia, unspecified: Secondary | ICD-10-CM | POA: Insufficient documentation

## 2019-02-17 DIAGNOSIS — Z79899 Other long term (current) drug therapy: Secondary | ICD-10-CM | POA: Insufficient documentation

## 2019-02-17 DIAGNOSIS — Z7689 Persons encountering health services in other specified circumstances: Secondary | ICD-10-CM | POA: Diagnosis not present

## 2019-02-17 DIAGNOSIS — Z1211 Encounter for screening for malignant neoplasm of colon: Secondary | ICD-10-CM | POA: Diagnosis not present

## 2019-02-17 DIAGNOSIS — Z86010 Personal history of colonic polyps: Secondary | ICD-10-CM | POA: Diagnosis not present

## 2019-02-17 HISTORY — PX: COLONOSCOPY WITH PROPOFOL: SHX5780

## 2019-02-17 HISTORY — DX: Other intervertebral disc degeneration, thoracic region: M51.34

## 2019-02-17 HISTORY — DX: Personal history of colon polyps, unspecified: Z86.0100

## 2019-02-17 HISTORY — DX: Unspecified cataract: H26.9

## 2019-02-17 HISTORY — DX: Rectocele: N81.6

## 2019-02-17 HISTORY — DX: Personal history of colonic polyps: Z86.010

## 2019-02-17 SURGERY — COLONOSCOPY WITH PROPOFOL
Anesthesia: General

## 2019-02-17 MED ORDER — LIDOCAINE HCL (PF) 2 % IJ SOLN
INTRAMUSCULAR | Status: AC
  Filled 2019-02-17: qty 10

## 2019-02-17 MED ORDER — EPHEDRINE SULFATE 50 MG/ML IJ SOLN
INTRAMUSCULAR | Status: AC
  Filled 2019-02-17: qty 1

## 2019-02-17 MED ORDER — PROPOFOL 10 MG/ML IV BOLUS
INTRAVENOUS | Status: DC | PRN
  Administered 2019-02-17: 60 mg via INTRAVENOUS

## 2019-02-17 MED ORDER — LIDOCAINE HCL (CARDIAC) PF 100 MG/5ML IV SOSY
PREFILLED_SYRINGE | INTRAVENOUS | Status: DC | PRN
  Administered 2019-02-17: 50 mg via INTRAVENOUS

## 2019-02-17 MED ORDER — SODIUM CHLORIDE 0.9 % IV SOLN
INTRAVENOUS | Status: DC
  Administered 2019-02-17: 08:00:00 via INTRAVENOUS

## 2019-02-17 MED ORDER — PHENYLEPHRINE HCL (PRESSORS) 10 MG/ML IV SOLN
INTRAVENOUS | Status: AC
  Filled 2019-02-17: qty 1

## 2019-02-17 MED ORDER — PROPOFOL 500 MG/50ML IV EMUL
INTRAVENOUS | Status: DC | PRN
  Administered 2019-02-17: 165 ug/kg/min via INTRAVENOUS

## 2019-02-17 MED ORDER — PROPOFOL 500 MG/50ML IV EMUL
INTRAVENOUS | Status: AC
  Filled 2019-02-17: qty 50

## 2019-02-17 NOTE — Anesthesia Postprocedure Evaluation (Signed)
Anesthesia Post Note  Patient: Cynthia Webster  Procedure(s) Performed: COLONOSCOPY WITH PROPOFOL (N/A )  Patient location during evaluation: Endoscopy Anesthesia Type: General Level of consciousness: awake and alert Pain management: pain level controlled Vital Signs Assessment: post-procedure vital signs reviewed and stable Respiratory status: spontaneous breathing, nonlabored ventilation, respiratory function stable and patient connected to nasal cannula oxygen Cardiovascular status: blood pressure returned to baseline and stable Postop Assessment: no apparent nausea or vomiting Anesthetic complications: no     Last Vitals:  Vitals:   02/17/19 0852 02/17/19 0902  BP: 118/78 110/75  Pulse: 94 86  Resp: (!) 21 19  Temp:    SpO2: 100% 99%    Last Pain:  Vitals:   02/17/19 0902  TempSrc:   PainSc: 0-No pain                 [name]

## 2019-02-17 NOTE — Op Note (Signed)
[facility] Patient Name: Cynthia Webster Procedure Date: 02/17/2019 7:26 AM MRN: [id number] Account #: [id number] Date of Birth: 07-Jan-1951 Admit Type: Outpatient Age: 68 Room: [facility] ENDO ROOM 1 Gender: Female Note Status: Finalized Procedure:             Colonoscopy Indications:           High risk colon cancer surveillance: Personal history                         of colonic polyps Providers:             Robert Bellow, MD Referring MD:          Precious Bard, MD (Referring MD) Medicines:             Monitored Anesthesia Care Complications:         No immediate complications. Procedure:             Pre-Anesthesia Assessment:                        - Prior to the procedure, a History and Physical was                         performed, and patient medications, allergies and                         sensitivities were reviewed. The patient's tolerance                         of previous anesthesia was reviewed.                        - The risks and benefits of the procedure and the                         sedation options and risks were discussed with the                         patient. All questions were answered and informed                         consent was obtained.                        After obtaining informed consent, the colonoscope was                         passed under direct vision. Throughout the procedure,                         the patient's blood pressure, pulse, and oxygen                         saturations were monitored continuously. The                         Colonoscope was introduced through the anus and                         advanced to the the terminal  ileum. The colonoscopy                         was performed without difficulty. The patient                         tolerated the procedure well. The quality of the bowel                         preparation was excellent. Findings:      A 5 mm polyp was  found in the recto-sigmoid colon. The polyp was       sessile. Biopsies were taken with a cold forceps for histology.      The retroflexed view of the distal rectum and anal verge was normal and       showed no anal or rectal abnormalities. Impression:            - One 5 mm polyp at the recto-sigmoid colon. Biopsied.                        - The distal rectum and anal verge are normal on                         retroflexion view. Recommendation:        - Repeat colonoscopy in 3 years for surveillance. Procedure Code(s):     --- Professional ---                        [phone], Colonoscopy, flexible; with biopsy, single or                         multiple Diagnosis Code(s):     --- Professional ---                        Z86.010, Personal history of colonic polyps                        K63.5, Polyp of colon CPT copyright 2019 [employer]. All rights reserved. The codes documented in this report are preliminary and upon coder review may  be revised to meet current compliance requirements. Robert Bellow, MD 02/17/2019 8:38:49 AM This report has been signed electronically. Number of Addenda: 0 Note Initiated On: 02/17/2019 7:26 AM Scope Withdrawal Time: 0 hours 8 minutes 54 seconds  Total Procedure Duration: 0 hours 12 minutes 5 seconds  Estimated Blood Loss:  Estimated blood loss: none.      [facility]

## 2019-02-17 NOTE — Transfer of Care (Signed)
Immediate Anesthesia Transfer of Care Note  Patient: Cynthia Webster  Procedure(s) Performed: COLONOSCOPY WITH PROPOFOL (N/A )  Patient Location: PACU  Anesthesia Type:General  Level of Consciousness: awake, alert  and oriented  Airway & Oxygen Therapy: Patient Spontanous Breathing and Patient connected to nasal cannula oxygen  Post-op Assessment: Report given to RN and Post -op Vital signs reviewed and stable  Post vital signs: Reviewed and stable  Last Vitals:  Vitals Value Taken Time  BP 98/65 02/17/19 0842  Temp 36.4 C 02/17/19 0842  Pulse 94 02/17/19 0842  Resp 23 02/17/19 0842  SpO2 98 % 02/17/19 0842  Vitals shown include unvalidated device data.  Last Pain:  Vitals:   02/17/19 0842  TempSrc: Temporal         Complications: No apparent anesthesia complications

## 2019-02-17 NOTE — Anesthesia Post-op Follow-up Note (Signed)
Anesthesia QCDR form completed.        

## 2019-02-17 NOTE — Anesthesia Preprocedure Evaluation (Signed)
Anesthesia Evaluation  Patient identified by MRN, date of birth, ID band Patient awake    Reviewed: Allergy & Precautions, H&P , NPO status , Patient's Chart, lab work & pertinent test results, reviewed documented beta blocker date and time   Airway Mallampati: III  TM Distance: >3 FB Neck ROM: limited    Dental  (+) Chipped   Pulmonary neg pulmonary ROS, neg shortness of breath,           Cardiovascular Exercise Tolerance: Good negative cardio ROS    Hyperlipidemia   Neuro/Psych negative neurological ROS  negative psych ROS   GI/Hepatic negative GI ROS, Neg liver ROS, neg GERD  ,  Endo/Other  negative endocrine ROS  Renal/GU negative Renal ROS  negative genitourinary   Musculoskeletal   Abdominal   Peds  Hematology negative hematology ROS (+)   Anesthesia Other Findings Past Medical History: No date: Cataract of both eyes 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), 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), thoracic No date: Gastritis No date: Hypercholesteremia No date: Osteopenia No date: Pelvic prolapse No date: Personal history of colonic polyps No date: Pulmonary nodule No date: Rectocele  Past Surgical History: No date: BREAST SURGERY; Right 08/23/2018: CATARACT EXTRACTION W/PHACO; Right     Comment:  Procedure: CATARACT EXTRACTION PHACO AND INTRAOCULAR               LENS PLACEMENT (IOC)  RIGHT;  Surgeon: Birder Robson,              MD;  Location: [place];  Service:               Ophthalmology;  Laterality: Right; No date: CHOLECYSTECTOMY No date: COLON SURGERY 12/03/2017: COLONOSCOPY WITH PROPOFOL; N/A     Comment:  Procedure: COLONOSCOPY WITH PROPOFOL;  Surgeon: Manya Silvas, MD;  Location: [facility] ENDOSCOPY;  Service:               Endoscopy;  Laterality: N/A; No date: EYE SURGERY No date: TONSILLECTOMY     Reproductive/Obstetrics negative OB ROS                              Anesthesia Physical  Anesthesia Plan  ASA: II  Anesthesia Plan: General   Post-op Pain Management:    Induction: Intravenous  PONV Risk Score and Plan: Propofol infusion and TIVA  Airway Management Planned: Natural Airway and Nasal Cannula  Additional Equipment:   Intra-op Plan:   Post-operative Plan:   Informed Consent: I have reviewed the patients History and Physical, chart, labs and discussed the procedure including the risks, benefits and alternatives for the proposed anesthesia with the patient or authorized representative who has indicated his/her understanding and acceptance.     Dental Advisory Given  Plan Discussed with: CRNA  Anesthesia Plan Comments: (Patient consented for risks of anesthesia including but not limited to:  - adverse reactions to medications - risk of intubation if required - damage to teeth, lips or other oral mucosa - sore throat or hoarseness - Damage to heart, brain, lungs or loss of life  Patient voiced understanding.)        Anesthesia Quick Evaluation

## 2019-02-17 NOTE — Anesthesia Procedure Notes (Signed)
Date/Time: 02/17/2019 8:20 AM Performed by: Johnna Acosta, CRNA Pre-anesthesia Checklist: Patient identified, Emergency Drugs available, Suction available, Patient being monitored and Timeout performed Patient Re-evaluated:Patient Re-evaluated prior to induction Oxygen Delivery Method: Nasal cannula Preoxygenation: Pre-oxygenation with 100% oxygen

## 2019-02-17 NOTE — H&P (Signed)
Cynthia Webster [id number] July 10, 1950     HPI:  68 y/o female with SSA of transverse and hepatic flexure last year. For repeat exam. Tolerated prep well.   HX: Rectocele.   Medications Prior to Admission  Medication Sig Dispense Refill Last Dose  . Alum & Mag Hydroxide-Simeth (MYLANTA PO) Take by mouth as needed.     . Multiple Vitamin (MULTIVITAMIN) tablet Take 1 tablet by mouth daily.     . simvastatin (ZOCOR) 10 MG tablet Take 10 mg by mouth daily.      No Known Allergies Past Medical History:  Diagnosis Date  . Cataract of both eyes   . DDD (degenerative disc disease), cervical   . DDD (degenerative disc disease), thoracic   . Gastritis   . Hypercholesteremia   . Osteopenia   . Pelvic prolapse   . Personal history of colonic polyps   . Pulmonary nodule   . Rectocele    Past Surgical History:  Procedure Laterality Date  . BREAST SURGERY Right   . CATARACT EXTRACTION W/PHACO Right 08/23/2018   Procedure: CATARACT EXTRACTION PHACO AND INTRAOCULAR LENS PLACEMENT ([place])  RIGHT;  Surgeon: Birder Robson, MD;  Location: [place];  Service: Ophthalmology;  Laterality: Right;  . CHOLECYSTECTOMY    . COLON SURGERY    . COLONOSCOPY WITH PROPOFOL N/A 12/03/2017   Procedure: COLONOSCOPY WITH PROPOFOL;  Surgeon: Manya Silvas, MD;  Location: [facility] ENDOSCOPY;  Service: Endoscopy;  Laterality: N/A;  . EYE SURGERY    . TONSILLECTOMY     Social History   Socioeconomic History  . Marital status: Married    Spouse name: Not on file  . Number of children: Not on file  . Years of education: Not on file  . Highest education level: Not on file  Occupational History  . Not on file  Social Needs  . Financial resource strain: Not on file  . Food insecurity    Worry: Not on file    Inability: Not on file  . Transportation needs    Medical: Not on file    Non-medical: Not on file  Tobacco Use  . Smoking status: Never Smoker  . Smokeless tobacco: Never Used  Substance  and Sexual Activity  . Alcohol use: Yes    Alcohol/week: 2.0 standard drinks    Types: 2 Glasses of wine per week    Comment: SOCIAL DRINK  . Drug use: No  . Sexual activity: Yes    Partners: Male    Birth control/protection: None  Lifestyle  . Physical activity    Days per week: Not on file    Minutes per session: Not on file  . Stress: Not on file  Relationships  . Social Herbalist on phone: Not on file    Gets together: Not on file    Attends religious service: Not on file    Active member of club or organization: Not on file    Attends meetings of clubs or organizations: Not on file    Relationship status: Not on file  . Intimate partner violence    Fear of current or ex partner: Not on file    Emotionally abused: Not on file    Physically abused: Not on file    Forced sexual activity: Not on file  Other Topics Concern  . Not on file  Social History Narrative  . Not on file   Social History   Social History Narrative  . Not on  file   Last colonoscopy: 12/03/2017: Findings: A large polyp was found in the ascending colon. The polyp was sessile.  It was oblong and on a fold and required repeated piecemeal removal. The  polyp was removed with a hot snare. Resection and retrieval were  complete. To prevent bleeding after the polypectomy, four hemostatic  clips were successfully placed. There was no bleeding during, or at the  end, of the procedure. A medium polyp was found in the transverse colon. The polyp was sessile.  The polyp was removed with a hot snare. Resection and retrieval were  complete. 2 clips applied to the site. Internal hemorrhoids were found during endoscopy. The hemorrhoids were  small and Grade I (internal hemorrhoids that do not prolapse). Impression: - One large polyp in the ascending colon, removed with  a hot snare. Resected and retrieved. Clips were placed. - One medium polyp in the transverse colon, removed  with a hot snare.  Resected and retrieved. - Internal hemorrhoids. Recommendation: - Await pathology results. Manya Silvas, MD DIAGNOSIS: A. COLON POLYP, HEPATIC FLEXURE; HOT SNARE: - SESSILE SERRATED ADENOMA. - NEGATIVE FOR HIGH-GRADE DYSPLASIA AND MALIGNANCY.  B. COLON POLYP, TRANSVERSE; HOT SNARE/COLD BIOPSY: - SESSILE SERRATED ADENOMA. - NEGATIVE FOR HIGH-GRADE DYSPLASIA AND MALIGNANCY.   ROS: Negative.     PE: HEENT: Negative. Lungs: Clear. Cardio: RR.  Assessment/Plan:  Proceed with planned endoscopy.   Forest Gleason  02/17/2019

## 2019-02-18 NOTE — Progress Notes (Signed)
Voicemail. No message left.

## 2019-02-20 ENCOUNTER — Encounter: Payer: Self-pay | Admitting: General Surgery

## 2019-02-20 LAB — SURGICAL PATHOLOGY

## 2019-04-06 DIAGNOSIS — H26492 Other secondary cataract, left eye: Secondary | ICD-10-CM | POA: Diagnosis not present

## 2019-07-31 ENCOUNTER — Encounter: Payer: Self-pay | Admitting: Obstetrics & Gynecology

## 2019-07-31 DIAGNOSIS — Z1231 Encounter for screening mammogram for malignant neoplasm of breast: Secondary | ICD-10-CM | POA: Diagnosis not present

## 2019-10-05 DIAGNOSIS — Z0000 Encounter for general adult medical examination without abnormal findings: Secondary | ICD-10-CM | POA: Diagnosis not present

## 2019-10-05 DIAGNOSIS — R7303 Prediabetes: Secondary | ICD-10-CM | POA: Diagnosis not present

## 2019-10-05 DIAGNOSIS — E782 Mixed hyperlipidemia: Secondary | ICD-10-CM | POA: Diagnosis not present

## 2019-10-05 DIAGNOSIS — E042 Nontoxic multinodular goiter: Secondary | ICD-10-CM | POA: Diagnosis not present

## 2019-10-12 DIAGNOSIS — D7589 Other specified diseases of blood and blood-forming organs: Secondary | ICD-10-CM | POA: Diagnosis not present

## 2019-10-12 DIAGNOSIS — R7303 Prediabetes: Secondary | ICD-10-CM | POA: Diagnosis not present

## 2019-10-12 DIAGNOSIS — Z1239 Encounter for other screening for malignant neoplasm of breast: Secondary | ICD-10-CM | POA: Diagnosis not present

## 2019-10-12 DIAGNOSIS — Z0000 Encounter for general adult medical examination without abnormal findings: Secondary | ICD-10-CM | POA: Diagnosis not present

## 2019-10-12 DIAGNOSIS — E782 Mixed hyperlipidemia: Secondary | ICD-10-CM | POA: Diagnosis not present

## 2019-12-13 DIAGNOSIS — E042 Nontoxic multinodular goiter: Secondary | ICD-10-CM | POA: Diagnosis not present

## 2019-12-18 DIAGNOSIS — E042 Nontoxic multinodular goiter: Secondary | ICD-10-CM | POA: Diagnosis not present

## 2019-12-22 ENCOUNTER — Other Ambulatory Visit: Payer: Self-pay | Admitting: Endocrinology

## 2019-12-22 DIAGNOSIS — E041 Nontoxic single thyroid nodule: Secondary | ICD-10-CM

## 2019-12-29 ENCOUNTER — Other Ambulatory Visit: Payer: Self-pay

## 2019-12-29 ENCOUNTER — Ambulatory Visit
Admission: RE | Admit: 2019-12-29 | Discharge: 2019-12-29 | Disposition: A | Discharge status: Home / Self Care | Payer: PPO | Source: Ambulatory Visit | Attending: Endocrinology | Admitting: Endocrinology

## 2019-12-29 DIAGNOSIS — E041 Nontoxic single thyroid nodule: Secondary | ICD-10-CM

## 2019-12-29 DIAGNOSIS — E042 Nontoxic multinodular goiter: Secondary | ICD-10-CM | POA: Diagnosis not present

## 2019-12-29 NOTE — Procedures (Signed)
Interventional Radiology Procedure:   Indications: Thyroid nodules  Procedure: US guided FNA thyroid nodule  Findings: 5 FNAs of left mid nodule, approximate size 3.2 cm  Complications: None     EBL: minimal  Plan: Discharge to home   Baruc Tugwell R. Anselm Pancoast, MD  Pager: [phone]

## 2020-01-01 LAB — CYTOLOGY - NON PAP

## 2020-03-26 ENCOUNTER — Ambulatory Visit: Payer: PPO | Admitting: Obstetrics & Gynecology

## 2020-03-26 ENCOUNTER — Encounter: Payer: Self-pay | Admitting: Obstetrics & Gynecology

## 2020-03-26 ENCOUNTER — Other Ambulatory Visit: Payer: Self-pay

## 2020-03-26 VITALS — BP 126/74 | Ht 65.0 in | Wt 173.0 lb

## 2020-03-26 DIAGNOSIS — Z13820 Encounter for screening for osteoporosis: Secondary | ICD-10-CM

## 2020-03-26 DIAGNOSIS — Z780 Asymptomatic menopausal state: Secondary | ICD-10-CM

## 2020-03-26 DIAGNOSIS — Z01419 Encounter for gynecological examination (general) (routine) without abnormal findings: Secondary | ICD-10-CM

## 2020-03-26 DIAGNOSIS — N816 Rectocele: Secondary | ICD-10-CM

## 2020-03-26 NOTE — Progress Notes (Signed)
Cynthia Webster 1951-03-16 [id number]   History:    70 y.o. G2P2L2 Married  RP:  Established patient presenting for annual gyn exam   HPI: Postmenopause, well on no hormone replacement therapy. No postmenopausal bleeding. No pelvic pain. Rectocele symptoms unchanged. No intercourse. Left anterior/inner vulvar biopsy 07/2017 was benign.Urine and bowel movements normal. Breasts normal. Body mass index 28.79. Health labs with family physician.   Past medical history,surgical history, family history and social history were all reviewed and documented in the EPIC chart.  Gynecologic History No LMP recorded. Patient is postmenopausal.  Obstetric History OB History  Gravida Para Term Preterm AB Living  2         2  SAB IAB Ectopic Multiple Live Births               # Outcome Date GA Lbr Len/2nd Weight Sex Delivery Anes PTL Lv  2 Gravida           1 Gravida              ROS: A ROS was performed and pertinent positives and negatives are included in the history.  GENERAL: No fevers or chills. HEENT: No change in vision, no earache, sore throat or sinus congestion. NECK: No pain or stiffness. CARDIOVASCULAR: No chest pain or pressure. No palpitations. PULMONARY: No shortness of breath, cough or wheeze. GASTROINTESTINAL: No abdominal pain, nausea, vomiting or diarrhea, melena or bright red blood per rectum. GENITOURINARY: No urinary frequency, urgency, hesitancy or dysuria. MUSCULOSKELETAL: No joint or muscle pain, no back pain, no recent trauma. DERMATOLOGIC: No rash, no itching, no lesions. ENDOCRINE: No polyuria, polydipsia, no heat or cold intolerance. No recent change in weight. HEMATOLOGICAL: No anemia or easy bruising or bleeding. NEUROLOGIC: No headache, seizures, numbness, tingling or weakness. PSYCHIATRIC: No depression, no loss of interest in normal activity or change in sleep pattern.     Exam:   BP 126/74   Ht 5\' 5"  (1.651 m)   Wt 173 lb (78.5 kg)   BMI 28.79  kg/m   Body mass index is 28.79 kg/m.  General appearance : Well developed well nourished female. No acute distress HEENT: Eyes: no retinal hemorrhage or exudates,  Neck supple, trachea midline, no carotid bruits, no thyroidmegaly Lungs: Clear to auscultation, no rhonchi or wheezes, or rib retractions  Heart: Regular rate and rhythm, no murmurs or gallops Breast:Examined in sitting and supine position were symmetrical in appearance, no palpable masses or tenderness,  no skin retraction, no nipple inversion, no nipple discharge, no skin discoloration, no axillary or supraclavicular lymphadenopathy Abdomen: no palpable masses or tenderness, no rebound or guarding Extremities: no edema or skin discoloration or tenderness  Pelvic: Vulva: Normal             Vagina: No gross lesions or discharge.  High rectocele grade 2/3.  Cervix: No gross lesions or discharge.  Pap reflex done.   Uterus  AV, normal size, shape and consistency, non-tender and mobile  Adnexa  Without masses or tenderness  Anus: Normal   Assessment/Plan:  70 y.o. female for annual exam    1. Encounter for routine gynecological examination with Papanicolaou smear of cervix Gynecologic exam in Menopause with a stable Rectocele.  Pap reflex done.  Breast exam normal.  Screening mammo negative 07/2019.  Colono 02/2019.  Health labs with Fam MD.  BMI 28.79.  Will increase her fitness.  Healthy nutrition.  2. Postmenopausal Well on no HRT.  No PMB.  3.  Rectocele Stable grade 2/3 rectocele, mildly symptomatic.  Observation.  4. Screening for osteoporosis Continue with Vit D supplements, Ca++ 1500 mg daily and weight bearing physical activity.  Will schedule BD here now. - DG Bone Density; Future  Princess Bruins MD, 3:32 PM 03/26/2020

## 2020-03-26 NOTE — Addendum Note (Signed)
Addended by: Thurnell Garbe A on: 03/26/2020 04:26 PM   Modules accepted: Orders

## 2020-03-27 LAB — PAP IG W/ RFLX HPV ASCU

## 2020-05-31 DIAGNOSIS — H26493 Other secondary cataract, bilateral: Secondary | ICD-10-CM | POA: Diagnosis not present

## 2020-07-12 DIAGNOSIS — M542 Cervicalgia: Secondary | ICD-10-CM | POA: Diagnosis not present

## 2020-07-12 DIAGNOSIS — M5030 Other cervical disc degeneration, unspecified cervical region: Secondary | ICD-10-CM | POA: Diagnosis not present

## 2020-07-12 DIAGNOSIS — M62838 Other muscle spasm: Secondary | ICD-10-CM | POA: Diagnosis not present

## 2020-08-01 DIAGNOSIS — Z1231 Encounter for screening mammogram for malignant neoplasm of breast: Secondary | ICD-10-CM | POA: Diagnosis not present

## 2020-08-05 DIAGNOSIS — R922 Inconclusive mammogram: Secondary | ICD-10-CM | POA: Diagnosis not present

## 2020-08-05 DIAGNOSIS — R928 Other abnormal and inconclusive findings on diagnostic imaging of breast: Secondary | ICD-10-CM | POA: Diagnosis not present

## 2020-08-21 DIAGNOSIS — N6313 Unspecified lump in the right breast, lower outer quadrant: Secondary | ICD-10-CM | POA: Diagnosis not present

## 2020-08-21 DIAGNOSIS — D241 Benign neoplasm of right breast: Secondary | ICD-10-CM | POA: Diagnosis not present

## 2020-09-13 ENCOUNTER — Encounter: Payer: Self-pay | Admitting: Obstetrics & Gynecology

## 2020-09-30 ENCOUNTER — Other Ambulatory Visit: Payer: Self-pay | Admitting: General Surgery

## 2020-09-30 DIAGNOSIS — Z9889 Other specified postprocedural states: Secondary | ICD-10-CM | POA: Diagnosis not present

## 2020-09-30 DIAGNOSIS — R928 Other abnormal and inconclusive findings on diagnostic imaging of breast: Secondary | ICD-10-CM

## 2020-09-30 DIAGNOSIS — R112 Nausea with vomiting, unspecified: Secondary | ICD-10-CM | POA: Diagnosis not present

## 2020-10-07 DIAGNOSIS — D7589 Other specified diseases of blood and blood-forming organs: Secondary | ICD-10-CM | POA: Diagnosis not present

## 2020-10-07 DIAGNOSIS — R7303 Prediabetes: Secondary | ICD-10-CM | POA: Diagnosis not present

## 2020-10-07 DIAGNOSIS — E782 Mixed hyperlipidemia: Secondary | ICD-10-CM | POA: Diagnosis not present

## 2020-10-14 DIAGNOSIS — Z0000 Encounter for general adult medical examination without abnormal findings: Secondary | ICD-10-CM | POA: Diagnosis not present

## 2020-10-14 DIAGNOSIS — E782 Mixed hyperlipidemia: Secondary | ICD-10-CM | POA: Diagnosis not present

## 2020-10-14 DIAGNOSIS — D7589 Other specified diseases of blood and blood-forming organs: Secondary | ICD-10-CM | POA: Diagnosis not present

## 2020-10-14 DIAGNOSIS — R7303 Prediabetes: Secondary | ICD-10-CM | POA: Diagnosis not present

## 2020-10-14 DIAGNOSIS — M47812 Spondylosis without myelopathy or radiculopathy, cervical region: Secondary | ICD-10-CM | POA: Diagnosis not present

## 2020-10-14 DIAGNOSIS — E042 Nontoxic multinodular goiter: Secondary | ICD-10-CM | POA: Diagnosis not present

## 2020-10-28 ENCOUNTER — Other Ambulatory Visit: Payer: Self-pay

## 2020-10-28 ENCOUNTER — Encounter (HOSPITAL_BASED_OUTPATIENT_CLINIC_OR_DEPARTMENT_OTHER): Payer: Self-pay | Admitting: General Surgery

## 2020-11-04 NOTE — H&P (Signed)
REFERRING PHYSICIAN:  Allyson Sabal, *   PROVIDER:  Georgianne Fick, MD   MRN: [id number] DOB: [birth year]    Subjective    Chief Complaint: Breast Mass       History of Present Illness: Cynthia Webster is a 70 y.o. female who is seen today as an office consultation at the request of Dr. Luan Pulling for evaluation of Breast Mass .     Pt is a very nice 70 yo F who had an abnormal screening mammogram at [facility]. A new oval mass was seen. She was then referred to [facility] for dx mammogram and ultrasound.  This showed two small masses in the LOQ on the right with one being 8 mm and one being 3 mm.  They were only 6 mm apart.  This was called BIRADS 4.  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  She is referred for excision.     She had a paternal aunt with breast cancer and a brother with colon cancer.  She has no cancer diagnoses.  She has had a prior excisional biopsy on the right at [facility] by Dr. Margurite Auerbach on 10/11/2000.  This path was complex sclerosing lesion with microscopic papilloma.  There was also usual ductal hyperplasia.       Review of Systems: A complete review of systems was obtained from the patient.  I have reviewed this information and discussed as appropriate with the patient.  See HPI as well for other ROS.   Review of Systems  Musculoskeletal: Positive for neck pain.  All other systems reviewed and are negative.       Medical History: Past Medical History      Past Medical History:  Diagnosis Date   DDD (degenerative disc disease), thoracic     History of adenomatous polyp of colon 11/30/2018   History of cataract     Hyperlipidemia     Pelvic prolapse     Rectocele             Patient Active Problem List  Diagnosis   Osteopenia   Mixed hyperlipidemia   Overweight (BMI 25.0-29.9)   DDD (degenerative disc disease), thoracic   Chronic bilateral thoracic back pain    Pure hypercholesterolemia   Mediastinal shift   Elevated diaphragm   Pulmonary nodule, right   History of adenomatous polyp of colon   Abnormal mammogram of right breast   PONV (postoperative nausea and vomiting)      Past Surgical History       Past Surgical History:  Procedure Laterality Date   BREAST EXCISIONAL BIOPSY Right [id number]   CATARACT EXTRACTION   04/2013    08/2018   CHOLECYSTECTOMY OPEN   1997   COLON SURGERY   Polyp removal 11/2017   COLONOSCOPY   12/03/2017    Sessile Serrated Adenoma: CBF 11/2018 Recall ltr mailed    COLONOSCOPY   02/17/2019    Hyperplastic colon polyp/PHx CP/Repeat 63yr/[facility]   TONSILLECTOMY   1956        Allergies  No Known Allergies           Current Outpatient Medications on File Prior to Visit  Medication Sig Dispense Refill   calcium carb/magnesium hydrox (ROLAIDS ORAL) Take by mouth       multivitamin capsule Take 1 capsule by mouth once daily       simvastatin (ZOCOR) 10 MG tablet TAKE 1 TABLET  BY MOUTH EVERY DAY AT NIGHT 90 tablet 0    No current facility-administered medications on file prior to visit.      Family History       Family History  Problem Relation Age of Onset   High blood pressure (Hypertension) Mother     Obesity Mother     Thyroid disease Mother     Stroke Father     Diabetes type II Paternal Aunt     Diabetes type II Paternal Grandmother     Colon polyps Brother     Skin cancer Brother     Glaucoma Maternal Grandmother          Social History       Tobacco Use  Smoking Status Never Smoker  Smokeless Tobacco Never Used      Social History  Social History         Socioeconomic History   Marital status: Married  Tobacco Use   Smoking status: Never Smoker   Smokeless tobacco: Never Used  Scientific laboratory technician Use: Never used  Substance and Sexual Activity   Alcohol use: Yes      Comment: Occasionally   Drug use: No   Sexual activity: Defer        Objective:         Vitals:       BP: 132/72  Pulse: 102  Temp: 36.8 C (98.2 F)  SpO2: 97%  Weight: 76.8 kg (169 lb 6.4 oz)  Height: 167.6 cm ('5\' 6"'$ )    Body mass index is 27.34 kg/m.     Head:   Normocephalic and atraumatic.  Mouth/Throat: Oropharynx is clear and moist. No oropharyngeal exudate.  Eyes:   Conjunctivae are normal. Pupils are equal, round, and reactive to light. No scleral icterus.  Neck:   Normal range of motion. Neck supple. No tracheal deviation present. No thyromegaly present.  Cardiovascular: Normal rate, regular rhythm, normal heart sounds and intact distal pulses.  Exam reveals no gallop and no friction rub.   No murmur heard. Breast: ptotic bilaterally.  Superior circumareolar incision on right well healed.  No palpable masses either breast.  No nipple retraction or discharge.  No LAD.  Left breast benign.   Respiratory: Effort normal and breath sounds normal. No respiratory distress. No wheezes, rales or rhonchi.  No chest wall tenderness.  GI:       Soft. Bowel sounds are normal. Abdomen is soft, non tender, non distended.  No masses or hepatosplenomegaly is present. There is no rebound and no guarding.  Musculoskeletal: . Extremities are non tender and without deformity.  Lymphadenopathy:   No cervical or axillary adenopathy.  Neurological: Alert and oriented to person, place, and time. Coordination normal.  Skin:    Skin is warm and dry. No rash noted. No diaphoresis. No erythema. No pallor.  Psychiatric: Normal mood and affect.Behavior is normal. Judgment and thought content normal.      Labs, Imaging and Diagnostic Testing: See above for description.  Path from [facility], accession # [id number]   Assessment and Plan:  Diagnoses and all orders for this visit:   Abnormal mammogram of right breast Assessment & Plan: Will plan seed localized excisional biopsy.   The surgical procedure was described to the patient.  I discussed the incision type and location and that we would need radiology  involved on with a wire or seed marker and/or sentinel node.       The risks and benefits of  the procedure were described to the patient and she wishes to proceed.     We discussed the risks bleeding, infection, damage to other structures, need for further procedures/surgeries.  We discussed the risk of seroma.  The patient was advised if the area in the breast in cancer, we may need to go back to surgery for additional tissue to obtain negative margins or for a lymph node biopsy. The patient was advised that these are the most common complications, but that others can occur as well.  They were advised against taking aspirin or other anti-inflammatory agents/blood thinners the week before surgery.         PONV (postoperative nausea and vomiting) Assessment & Plan: Pt would like to have propofol anesthesia if possible.  She knows she does well with that and has had significant issues post op with general.             No follow-ups on file.   Georgianne Fick, MD

## 2020-11-05 DIAGNOSIS — R928 Other abnormal and inconclusive findings on diagnostic imaging of breast: Secondary | ICD-10-CM | POA: Diagnosis not present

## 2020-11-05 MED ORDER — ENSURE PRE-SURGERY PO LIQD: 296.0000 mL | Freq: Once | ORAL | Status: DC

## 2020-11-05 NOTE — Anesthesia Preprocedure Evaluation (Addendum)
Anesthesia Evaluation  Patient identified by MRN, date of birth, ID band Patient awake    Reviewed: Allergy & Precautions, H&P , NPO status , Patient's Chart, lab work & pertinent test results, reviewed documented beta blocker date and time   Airway Mallampati: I  TM Distance: >3 FB Neck ROM: Full    Dental  (+) Chipped   Pulmonary neg pulmonary ROS, neg shortness of breath,    Pulmonary exam normal        Cardiovascular Exercise Tolerance: Good negative cardio ROS Normal cardiovascular exam  Hyperlipidemia   Neuro/Psych negative neurological ROS  negative psych ROS   GI/Hepatic negative GI ROS, Neg liver ROS, neg GERD  ,  Endo/Other  negative endocrine ROS  Renal/GU negative Renal ROS  negative genitourinary   Musculoskeletal   Abdominal Normal abdominal exam  (+)   Peds  Hematology negative hematology ROS (+)   Anesthesia Other Findings Past Medical History: No date: Cataract of both eyes 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), 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), thoracic No date: Gastritis No date: Hypercholesteremia No date: Osteopenia No date: Pelvic prolapse No date: Personal history of colonic polyps No date: Pulmonary nodule No date: Rectocele  Past Surgical History: No date: BREAST SURGERY; Right 08/23/2018: CATARACT EXTRACTION W/PHACO; Right     Comment:  Procedure: CATARACT EXTRACTION PHACO AND INTRAOCULAR               LENS PLACEMENT (IOC)  RIGHT;  Surgeon: Birder Robson,              MD;  Location: [place];  Service:               Ophthalmology;  Laterality: Right; No date: CHOLECYSTECTOMY No date: COLON SURGERY 12/03/2017: COLONOSCOPY WITH PROPOFOL; N/A     Comment:  Procedure: COLONOSCOPY WITH PROPOFOL;  Surgeon: Manya Silvas, MD;  Location: [facility] ENDOSCOPY;  Service:               Endoscopy;  Laterality: N/A; No date: EYE SURGERY No date:  TONSILLECTOMY     Reproductive/Obstetrics negative OB ROS                            Anesthesia Physical  Anesthesia Plan  ASA: II  Anesthesia Plan: General   Post-op Pain Management:    Induction: Intravenous  PONV Risk Score and Plan: 4 or greater and Ondansetron and Dexamethasone  Airway Management Planned: LMA  Additional Equipment: None  Intra-op Plan:   Post-operative Plan: Extubation in OR  Informed Consent: I have reviewed the patients History and Physical, chart, labs and discussed the procedure including the risks, benefits and alternatives for the proposed anesthesia with the patient or authorized representative who has indicated his/her understanding and acceptance.     Dental advisory given  Plan Discussed with: CRNA  Anesthesia Plan Comments:        Anesthesia Quick Evaluation

## 2020-11-05 NOTE — Progress Notes (Signed)

## 2020-11-06 ENCOUNTER — Ambulatory Visit (HOSPITAL_BASED_OUTPATIENT_CLINIC_OR_DEPARTMENT_OTHER): Payer: PPO | Admitting: Anesthesiology

## 2020-11-06 ENCOUNTER — Encounter (HOSPITAL_BASED_OUTPATIENT_CLINIC_OR_DEPARTMENT_OTHER)
Admission: RE | Disposition: A | Discharge status: Home / Self Care | Payer: Self-pay | Source: Home / Self Care | Attending: General Surgery

## 2020-11-06 ENCOUNTER — Encounter (HOSPITAL_BASED_OUTPATIENT_CLINIC_OR_DEPARTMENT_OTHER): Payer: Self-pay | Admitting: General Surgery

## 2020-11-06 ENCOUNTER — Ambulatory Visit (HOSPITAL_BASED_OUTPATIENT_CLINIC_OR_DEPARTMENT_OTHER)
Admission: RE | Admit: 2020-11-06 | Discharge: 2020-11-06 | Disposition: A | Discharge status: Home / Self Care | Payer: PPO | Attending: General Surgery | Admitting: General Surgery

## 2020-11-06 ENCOUNTER — Other Ambulatory Visit: Payer: Self-pay

## 2020-11-06 DIAGNOSIS — Z8249 Family history of ischemic heart disease and other diseases of the circulatory system: Secondary | ICD-10-CM | POA: Diagnosis not present

## 2020-11-06 DIAGNOSIS — R9389 Abnormal findings on diagnostic imaging of other specified body structures: Secondary | ICD-10-CM | POA: Diagnosis present

## 2020-11-06 DIAGNOSIS — D241 Benign neoplasm of right breast: Secondary | ICD-10-CM | POA: Diagnosis not present

## 2020-11-06 DIAGNOSIS — Z800 Family history of malignant neoplasm of digestive organs: Secondary | ICD-10-CM | POA: Diagnosis not present

## 2020-11-06 DIAGNOSIS — R928 Other abnormal and inconclusive findings on diagnostic imaging of breast: Secondary | ICD-10-CM | POA: Diagnosis not present

## 2020-11-06 DIAGNOSIS — Z8349 Family history of other endocrine, nutritional and metabolic diseases: Secondary | ICD-10-CM | POA: Insufficient documentation

## 2020-11-06 DIAGNOSIS — K2970 Gastritis, unspecified, without bleeding: Secondary | ICD-10-CM | POA: Diagnosis not present

## 2020-11-06 DIAGNOSIS — Z803 Family history of malignant neoplasm of breast: Secondary | ICD-10-CM | POA: Insufficient documentation

## 2020-11-06 DIAGNOSIS — Z823 Family history of stroke: Secondary | ICD-10-CM | POA: Insufficient documentation

## 2020-11-06 DIAGNOSIS — Z833 Family history of diabetes mellitus: Secondary | ICD-10-CM | POA: Diagnosis not present

## 2020-11-06 DIAGNOSIS — Z79899 Other long term (current) drug therapy: Secondary | ICD-10-CM | POA: Insufficient documentation

## 2020-11-06 DIAGNOSIS — E7800 Pure hypercholesterolemia, unspecified: Secondary | ICD-10-CM | POA: Diagnosis not present

## 2020-11-06 DIAGNOSIS — N816 Rectocele: Secondary | ICD-10-CM | POA: Diagnosis not present

## 2020-11-06 HISTORY — PX: RADIOACTIVE SEED GUIDED EXCISIONAL BREAST BIOPSY: SHX6490

## 2020-11-06 SURGERY — RADIOACTIVE SEED GUIDED BREAST BIOPSY
Anesthesia: General | Site: Breast | Laterality: Right

## 2020-11-06 MED ORDER — PROPOFOL 500 MG/50ML IV EMUL
INTRAVENOUS | Status: DC | PRN
  Administered 2020-11-06: 100 ug/kg/min via INTRAVENOUS

## 2020-11-06 MED ORDER — ACETAMINOPHEN 10 MG/ML IV SOLN: 1000.0000 mg | Freq: Once | INTRAVENOUS | Status: DC | PRN

## 2020-11-06 MED ORDER — MIDAZOLAM HCL 2 MG/2ML IJ SOLN
INTRAMUSCULAR | Status: AC
  Filled 2020-11-06: qty 2

## 2020-11-06 MED ORDER — ONDANSETRON HCL 4 MG/2ML IJ SOLN
INTRAMUSCULAR | Status: DC | PRN
  Administered 2020-11-06: 4 mg via INTRAVENOUS

## 2020-11-06 MED ORDER — OXYCODONE HCL 5 MG PO TABS: 5.0000 mg | ORAL_TABLET | Freq: Once | ORAL | Status: DC | PRN

## 2020-11-06 MED ORDER — OXYCODONE HCL 5 MG/5ML PO SOLN
5.0000 mg | Freq: Once | ORAL | Status: DC | PRN
Start: 2020-11-06 — End: 2020-11-06

## 2020-11-06 MED ORDER — FENTANYL CITRATE (PF) 100 MCG/2ML IJ SOLN
INTRAMUSCULAR | Status: AC
  Filled 2020-11-06: qty 2

## 2020-11-06 MED ORDER — ACETAMINOPHEN 325 MG PO TABS: 325.0000 mg | ORAL_TABLET | ORAL | Status: DC | PRN

## 2020-11-06 MED ORDER — CEFAZOLIN SODIUM-DEXTROSE 2-4 GM/100ML-% IV SOLN
INTRAVENOUS | Status: AC
  Filled 2020-11-06: qty 100

## 2020-11-06 MED ORDER — PROPOFOL 500 MG/50ML IV EMUL
INTRAVENOUS | Status: AC
  Filled 2020-11-06: qty 50

## 2020-11-06 MED ORDER — DEXAMETHASONE SODIUM PHOSPHATE 4 MG/ML IJ SOLN
INTRAMUSCULAR | Status: DC | PRN
  Administered 2020-11-06: 5 mg via INTRAVENOUS

## 2020-11-06 MED ORDER — LIDOCAINE HCL (CARDIAC) PF 100 MG/5ML IV SOSY
PREFILLED_SYRINGE | INTRAVENOUS | Status: DC | PRN
  Administered 2020-11-06: 100 mg via INTRAVENOUS

## 2020-11-06 MED ORDER — FENTANYL CITRATE (PF) 100 MCG/2ML IJ SOLN: 25.0000 ug | INTRAMUSCULAR | Status: DC | PRN

## 2020-11-06 MED ORDER — TRAMADOL HCL 50 MG PO TABS: 50.0000 mg | ORAL_TABLET | Freq: Four times a day (QID) | ORAL | 0 refills | Status: DC | PRN

## 2020-11-06 MED ORDER — CHLORHEXIDINE GLUCONATE CLOTH 2 % EX PADS: 6.0000 | MEDICATED_PAD | Freq: Once | CUTANEOUS | Status: DC

## 2020-11-06 MED ORDER — LIDOCAINE HCL (PF) 1 % IJ SOLN
INTRAMUSCULAR | Status: AC
  Filled 2020-11-06: qty 30

## 2020-11-06 MED ORDER — ACETAMINOPHEN 160 MG/5ML PO SOLN: 325.0000 mg | ORAL | Status: DC | PRN

## 2020-11-06 MED ORDER — MIDAZOLAM HCL 5 MG/5ML IJ SOLN
INTRAMUSCULAR | Status: DC | PRN
  Administered 2020-11-06: 2 mg via INTRAVENOUS

## 2020-11-06 MED ORDER — DEXAMETHASONE SODIUM PHOSPHATE 10 MG/ML IJ SOLN
INTRAMUSCULAR | Status: AC
  Filled 2020-11-06: qty 1

## 2020-11-06 MED ORDER — ONDANSETRON HCL 4 MG/2ML IJ SOLN
INTRAMUSCULAR | Status: AC
  Filled 2020-11-06: qty 2

## 2020-11-06 MED ORDER — SCOPOLAMINE 1 MG/3DAYS TD PT72: 1.0000 | MEDICATED_PATCH | TRANSDERMAL | Status: DC

## 2020-11-06 MED ORDER — LACTATED RINGERS IV SOLN: INTRAVENOUS | Status: DC

## 2020-11-06 MED ORDER — ACETAMINOPHEN 500 MG PO TABS
1000.0000 mg | ORAL_TABLET | ORAL | Status: AC
Start: 2020-11-06 — End: 2020-11-06
  Administered 2020-11-06: 1000 mg via ORAL

## 2020-11-06 MED ORDER — MEPERIDINE HCL 25 MG/ML IJ SOLN: 6.2500 mg | INTRAMUSCULAR | Status: DC | PRN

## 2020-11-06 MED ORDER — FENTANYL CITRATE (PF) 100 MCG/2ML IJ SOLN
INTRAMUSCULAR | Status: DC | PRN
  Administered 2020-11-06: 100 ug via INTRAVENOUS

## 2020-11-06 MED ORDER — BUPIVACAINE-EPINEPHRINE 0.5% -1:200000 IJ SOLN
INTRAMUSCULAR | Status: DC | PRN
  Administered 2020-11-06: 20 mL

## 2020-11-06 MED ORDER — CEFAZOLIN SODIUM-DEXTROSE 2-4 GM/100ML-% IV SOLN
2.0000 g | INTRAVENOUS | Status: AC
  Administered 2020-11-06: 2 g via INTRAVENOUS

## 2020-11-06 MED ORDER — ONDANSETRON HCL 4 MG/2ML IJ SOLN: 4.0000 mg | Freq: Once | INTRAMUSCULAR | Status: DC | PRN

## 2020-11-06 MED ORDER — PROPOFOL 10 MG/ML IV BOLUS
INTRAVENOUS | Status: DC | PRN
  Administered 2020-11-06: 100 mg via INTRAVENOUS

## 2020-11-06 MED ORDER — LIDOCAINE HCL (PF) 1 % IJ SOLN
INTRAMUSCULAR | Status: DC | PRN
  Administered 2020-11-06: 20 mL

## 2020-11-06 MED ORDER — ACETAMINOPHEN 500 MG PO TABS
ORAL_TABLET | ORAL | Status: AC
  Filled 2020-11-06: qty 2

## 2020-11-06 MED ORDER — EPHEDRINE 5 MG/ML INJ
INTRAVENOUS | Status: AC
  Filled 2020-11-06: qty 10

## 2020-11-06 SURGICAL SUPPLY — 53 items
ADH SKN CLS APL DERMABOND .7 (GAUZE/BANDAGES/DRESSINGS) ×1
APL PRP STRL LF DISP 70% ISPRP (MISCELLANEOUS) ×1
BINDER BREAST XLRG (GAUZE/BANDAGES/DRESSINGS) ×3 IMPLANT
BLADE SURG 10 STRL SS (BLADE) ×3 IMPLANT
BLADE SURG 15 STRL LF DISP TIS (BLADE) ×1 IMPLANT
BLADE SURG 15 STRL SS (BLADE) ×3
CANISTER SUC SOCK COL 7IN (MISCELLANEOUS) IMPLANT
CANISTER SUCT 1200ML W/VALVE (MISCELLANEOUS) ×3 IMPLANT
CHLORAPREP W/TINT 26 (MISCELLANEOUS) ×3 IMPLANT
CLIP TI LARGE 6 (CLIP) ×3 IMPLANT
CLOSURE WOUND 1/2 X4 (GAUZE/BANDAGES/DRESSINGS) ×1
COVER BACK TABLE 60X90IN (DRAPES) ×3 IMPLANT
COVER MAYO STAND STRL (DRAPES) ×3 IMPLANT
COVER PROBE W GEL 5X96 (DRAPES) ×3 IMPLANT
DECANTER SPIKE VIAL GLASS SM (MISCELLANEOUS) IMPLANT
DERMABOND ADVANCED (GAUZE/BANDAGES/DRESSINGS) ×2
DERMABOND ADVANCED .7 DNX12 (GAUZE/BANDAGES/DRESSINGS) ×1 IMPLANT
DRAPE LAPAROSCOPIC ABDOMINAL (DRAPES) ×3 IMPLANT
DRAPE UTILITY XL STRL (DRAPES) ×3 IMPLANT
ELECT COATED BLADE 2.86 ST (ELECTRODE) ×3 IMPLANT
ELECT REM PT RETURN 9FT ADLT (ELECTROSURGICAL) ×3
ELECTRODE REM PT RTRN 9FT ADLT (ELECTROSURGICAL) ×1 IMPLANT
GAUZE SPONGE 4X4 12PLY STRL LF (GAUZE/BANDAGES/DRESSINGS) IMPLANT
GLOVE SURG ENC MOIS LTX SZ6 (GLOVE) IMPLANT
GLOVE SURG ENC MOIS LTX SZ6.5 (GLOVE) ×6 IMPLANT
GLOVE SURG UNDER POLY LF SZ6.5 (GLOVE) ×3 IMPLANT
GLOVE SURG UNDER POLY LF SZ7 (GLOVE) ×3 IMPLANT
GOWN STRL REUS W/ TWL LRG LVL3 (GOWN DISPOSABLE) ×2 IMPLANT
GOWN STRL REUS W/TWL 2XL LVL3 (GOWN DISPOSABLE) ×3 IMPLANT
GOWN STRL REUS W/TWL LRG LVL3 (GOWN DISPOSABLE) ×6
KIT MARKER MARGIN INK (KITS) ×3 IMPLANT
LIGHT WAVEGUIDE WIDE FLAT (MISCELLANEOUS) IMPLANT
NEEDLE HYPO 25X1 1.5 SAFETY (NEEDLE) ×3 IMPLANT
NS IRRIG 1000ML POUR BTL (IV SOLUTION) ×3 IMPLANT
PACK BASIN DAY SURGERY FS (CUSTOM PROCEDURE TRAY) ×3 IMPLANT
PENCIL SMOKE EVACUATOR (MISCELLANEOUS) ×3 IMPLANT
SLEEVE SCD COMPRESS KNEE MED (STOCKING) ×3 IMPLANT
SPONGE T-LAP 18X18 ~~LOC~~+RFID (SPONGE) ×3 IMPLANT
STAPLER VISISTAT 35W (STAPLE) IMPLANT
STRIP CLOSURE SKIN 1/2X4 (GAUZE/BANDAGES/DRESSINGS) ×2 IMPLANT
SUT MON AB 4-0 PC3 18 (SUTURE) ×3 IMPLANT
SUT SILK 2 0 SH (SUTURE) IMPLANT
SUT VIC AB 2-0 SH 18 (SUTURE) IMPLANT
SUT VIC AB 3-0 SH 27 (SUTURE) ×3
SUT VIC AB 3-0 SH 27X BRD (SUTURE) ×1 IMPLANT
SUT VICRYL 3-0 CR8 SH (SUTURE) IMPLANT
SYR BULB EAR ULCER 3OZ GRN STR (SYRINGE) ×3 IMPLANT
SYR CONTROL 10ML LL (SYRINGE) ×3 IMPLANT
TOWEL GREEN STERILE FF (TOWEL DISPOSABLE) ×3 IMPLANT
TRAY FAXITRON CT DISP (TRAY / TRAY PROCEDURE) ×3 IMPLANT
TUBE CONNECTING 20'X1/4 (TUBING) ×1
TUBE CONNECTING 20X1/4 (TUBING) ×2 IMPLANT
YANKAUER SUCT BULB TIP NO VENT (SUCTIONS) ×3 IMPLANT

## 2020-11-06 NOTE — Interval H&P Note (Signed)
History and Physical Interval Note:  11/06/2020 8:59 AM  Cynthia Webster  has presented today for surgery, with the diagnosis of ABNORMAL RIGHT MAMMOGRAM.  The various methods of treatment have been discussed with the patient and family. After consideration of risks, benefits and other options for treatment, the patient has consented to  Procedure(s): RIGHT BREAST RADIOACTIVE SEED GUIDED EXCISIONAL BREAST BIOPSY (Right) as a surgical intervention.  The patient's history has been reviewed, patient examined, no change in status, stable for surgery.  I have reviewed the patient's chart and labs.  Questions were answered to the patient's satisfaction.     Stark Klein

## 2020-11-06 NOTE — Anesthesia Postprocedure Evaluation (Signed)
Anesthesia Post Note  Patient: Cynthia Webster  Procedure(s) Performed: RIGHT BREAST RADIOACTIVE SEED GUIDED EXCISIONAL BREAST BIOPSY (Right: Breast)     Patient location during evaluation: Phase II Anesthesia Type: General Level of consciousness: awake and sedated Pain management: pain level controlled Vital Signs Assessment: post-procedure vital signs reviewed and stable Respiratory status: spontaneous breathing Cardiovascular status: stable Postop Assessment: no apparent nausea or vomiting Anesthetic complications: no   No notable events documented.  Last Vitals:  Vitals:   11/06/20 1015 11/06/20 1025  BP: 113/72 121/69  Pulse: 100 (!) 103  Resp: 17 16  Temp:  (!) 36.2 C  SpO2: 99% 95%    Last Pain:  Vitals:   11/06/20 1025  TempSrc:   PainSc: 0-No pain                 Huston Foley

## 2020-11-06 NOTE — Transfer of Care (Signed)
Immediate Anesthesia Transfer of Care Note  Patient: Cynthia Webster  Procedure(s) Performed: RIGHT BREAST RADIOACTIVE SEED GUIDED EXCISIONAL BREAST BIOPSY (Right: Breast)  Patient Location: PACU  Anesthesia Type:General  Level of Consciousness: awake  Airway & Oxygen Therapy: Patient Spontanous Breathing and Patient connected to face mask oxygen  Post-op Assessment: Report given to RN and Post -op Vital signs reviewed and stable  Post vital signs: Reviewed and stable  Last Vitals:  Vitals Value Taken Time  BP    Temp    Pulse 110 11/06/20 1002  Resp 20 11/06/20 1002  SpO2 94 % 11/06/20 1002  Vitals shown include unvalidated device data.  Last Pain:  Vitals:   11/06/20 0807  TempSrc: Oral  PainSc: 0-No pain      Patients Stated Pain Goal: 1 ([id number])  Complications: No notable events documented.

## 2020-11-06 NOTE — Discharge Instructions (Addendum)
[place] Office Phone Number [phone]  BREAST BIOPSY/ PARTIAL MASTECTOMY: POST OP INSTRUCTIONS  Always review your discharge instruction sheet given to you by the facility where your surgery was performed.  IF YOU HAVE DISABILITY OR FAMILY LEAVE FORMS, YOU MUST BRING THEM TO THE OFFICE FOR PROCESSING.  DO NOT GIVE THEM TO YOUR DOCTOR.  A prescription for pain medication may be given to you upon discharge.  Take your pain medication as prescribed, if needed.  If narcotic pain medicine is not needed, then you may take acetaminophen (Tylenol) or ibuprofen (Advil) as needed. Take your usually prescribed medications unless otherwise directed If you need a refill on your pain medication, please contact your pharmacy.  They will contact our office to request authorization.  Prescriptions will not be filled after 5pm or on week-ends. You should eat very light the first 24 hours after surgery, such as soup, crackers, pudding, etc.  Resume your normal diet the day after surgery. Most patients will experience some swelling and bruising in the breast.  Ice packs and a good support bra will help.  Swelling and bruising can take several days to resolve.  It is common to experience some constipation if taking pain medication after surgery.  Increasing fluid intake and taking a stool softener will usually help or prevent this problem from occurring.  A mild laxative (Milk of Magnesia or Miralax) should be taken according to package directions if there are no bowel movements after 48 hours. Unless discharge instructions indicate otherwise, you may remove your bandages 48 hours after surgery, and you may shower at that time.  You may have steri-strips (small skin tapes) in place directly over the incision.  These strips should be left on the skin for 7-10 days.   Any sutures or staples will be removed at the office during your follow-up visit. ACTIVITIES:  You may resume regular daily activities  (gradually increasing) beginning the next day.  Wearing a good support bra or sports bra (or the breast binder) minimizes pain and swelling.  You may have sexual intercourse when it is comfortable. You may drive when you no longer are taking prescription pain medication, you can comfortably wear a seatbelt, and you can safely maneuver your car and apply brakes. RETURN TO WORK:  __________1 week_______________ Dennis Bast should see your doctor in the office for a follow-up appointment approximately two weeks after your surgery.  Your doctor's nurse will typically make your follow-up appointment when she calls you with your pathology report.  Expect your pathology report 2-3 business days after your surgery.  You may call to check if you do not hear from [place] after three days.   WHEN TO CALL YOUR DOCTOR: Fever over 101.0 Nausea and/or vomiting. Extreme swelling or bruising. Continued bleeding from incision. Increased pain, redness, or drainage from the incision.  The clinic staff is available to answer your questions during regular business hours.  Please don't hesitate to call and ask to speak to one of the nurses for clinical concerns.  If you have a medical emergency, go to the nearest emergency room or call 911.  A surgeon from [facility] is always on call at the hospital.  For further questions, please visit [email or web address]  You may have Tylenol after 2pm today, if needed.    Post Anesthesia Home Care Instructions  Activity: Get plenty of rest for the remainder of the day. A responsible individual must stay with you for 24 hours following the procedure.  For  the next 24 hours, DO NOT: -Drive a car -Paediatric nurse -Drink alcoholic beverages -Take any medication unless instructed by your physician -Make any legal decisions or sign important papers.  Meals: Start with liquid foods such as gelatin or soup. Progress to regular foods as tolerated. Avoid greasy, spicy,  heavy foods. If nausea and/or vomiting occur, drink only clear liquids until the nausea and/or vomiting subsides. Call your physician if vomiting continues.  Special Instructions/Symptoms: Your throat may feel dry or sore from the anesthesia or the breathing tube placed in your throat during surgery. If this causes discomfort, gargle with warm salt water. The discomfort should disappear within 24 hours.  If you had a scopolamine patch placed behind your ear for the management of post- operative nausea and/or vomiting:  1. The medication in the patch is effective for 72 hours, after which it should be removed.  Wrap patch in a tissue and discard in the trash. Wash hands thoroughly with soap and water. 2. You may remove the patch earlier than 72 hours if you experience unpleasant side effects which may include dry mouth, dizziness or visual disturbances. 3. Avoid touching the patch. Wash your hands with soap and water after contact with the patch.

## 2020-11-06 NOTE — Op Note (Signed)
Right Breast Radioactive seed localized excisional biopsy  Indications: This patient presents with history of abnormal right mammogram with discordant core needle biopsy.    Pre-operative Diagnosis: abnormal right mammogram    Post-operative Diagnosis: abnormal right mammogram  Surgeon: Stark Klein   Anesthesia: General endotracheal anesthesia  ASA Class: 2  Procedure Details  The patient was seen in the Holding Room. The risks, benefits, complications, treatment options, and expected outcomes were discussed with the patient. The possibilities of bleeding, infection, the need for additional procedures, failure to diagnose a condition, and creating a complication requiring transfusion or operation were discussed with the patient. The patient concurred with the proposed plan, giving informed consent.  The site of surgery properly noted/marked. The patient was taken to Operating Room # 2, identified, and the procedure verified as right Breast seed localized excisional biopsy. A Time Out was held and the above information confirmed.  The right breast and chest were prepped and draped in standard fashion. A lateral circumareolar incision was made near the previously placed radioactive seed.  Dissection was carried down around the point of maximum signal intensity. The cautery was used to perform the dissection.   The specimen was inked with the margin marker paint kit.    Specimen radiography confirmed inclusion of the mammographic lesion, the clip, and the seed.  The background signal in the breast was zero.  One clip was placed in the breast cavity.  Hemostasis was achieved with cautery.  The wound was irrigated and closed with 3-0 vicryl interrupted deep dermal sutures and 4-0 monocryl running subcuticular suture.      Sterile dressings were applied. At the end of the operation, all sponge, instrument, and needle counts were correct.  Findings: Seed, clip in specimen.  Anterior margin is  skin  Estimated Blood Loss:  min         Specimens: right breast tissue with seed         Complications:  None; patient tolerated the procedure well.         Disposition: PACU - hemodynamically stable.         Condition: stable

## 2020-11-06 NOTE — Anesthesia Procedure Notes (Signed)
Procedure Name: LMA Insertion Date/Time: 11/06/2020 9:18 AM Performed by: Tawni Millers, CRNA Pre-anesthesia Checklist: Patient identified, Emergency Drugs available, Suction available and Patient being monitored Patient Re-evaluated:Patient Re-evaluated prior to induction Oxygen Delivery Method: Circle system utilized Preoxygenation: Pre-oxygenation with 100% oxygen Induction Type: IV induction Ventilation: Mask ventilation without difficulty LMA: LMA inserted LMA Size: 3.0 Number of attempts: 1 Airway Equipment and Method: Bite block Placement Confirmation: positive ETCO2 Tube secured with: Tape Dental Injury: Teeth and Oropharynx as per pre-operative assessment

## 2020-11-07 ENCOUNTER — Encounter (HOSPITAL_BASED_OUTPATIENT_CLINIC_OR_DEPARTMENT_OTHER): Payer: Self-pay | Admitting: General Surgery

## 2020-11-08 LAB — SURGICAL PATHOLOGY

## 2020-12-12 DIAGNOSIS — E042 Nontoxic multinodular goiter: Secondary | ICD-10-CM | POA: Diagnosis not present

## 2020-12-12 DIAGNOSIS — E041 Nontoxic single thyroid nodule: Secondary | ICD-10-CM | POA: Diagnosis not present

## 2021-07-02 ENCOUNTER — Encounter (HOSPITAL_COMMUNITY): Payer: Self-pay

## 2021-07-02 ENCOUNTER — Other Ambulatory Visit: Payer: Self-pay | Admitting: Physician Assistant

## 2021-07-02 DIAGNOSIS — R519 Headache, unspecified: Secondary | ICD-10-CM | POA: Diagnosis not present

## 2021-07-02 DIAGNOSIS — M542 Cervicalgia: Secondary | ICD-10-CM

## 2021-07-12 ENCOUNTER — Ambulatory Visit
Admission: RE | Admit: 2021-07-12 | Discharge: 2021-07-12 | Disposition: A | Discharge status: Home / Self Care | Payer: PPO | Source: Ambulatory Visit | Attending: Physician Assistant | Admitting: Physician Assistant

## 2021-07-12 DIAGNOSIS — M542 Cervicalgia: Secondary | ICD-10-CM | POA: Insufficient documentation

## 2021-07-12 DIAGNOSIS — R519 Headache, unspecified: Secondary | ICD-10-CM

## 2021-07-12 MED ORDER — GADOBUTROL 1 MMOL/ML IV SOLN
7.5000 mL | Freq: Once | INTRAVENOUS | Status: AC | PRN
  Administered 2021-07-12: 7.5 mL via INTRAVENOUS

## 2021-07-23 DIAGNOSIS — R519 Headache, unspecified: Secondary | ICD-10-CM | POA: Diagnosis not present

## 2021-07-23 DIAGNOSIS — M4802 Spinal stenosis, cervical region: Secondary | ICD-10-CM | POA: Diagnosis not present

## 2021-07-30 ENCOUNTER — Encounter: Payer: Self-pay | Admitting: Obstetrics & Gynecology

## 2021-07-30 ENCOUNTER — Ambulatory Visit: Payer: PPO | Admitting: Obstetrics & Gynecology

## 2021-07-30 VITALS — BP 116/72

## 2021-07-30 DIAGNOSIS — N9089 Other specified noninflammatory disorders of vulva and perineum: Secondary | ICD-10-CM | POA: Diagnosis not present

## 2021-07-30 NOTE — Progress Notes (Signed)
    Cynthia Webster 01/26/1951 [id number]        71 y.o.  G2P2L2  RP: Vulvar bump noticed 2 weeks ago  HPI: Noticed a small bump on the left mid vulva x 2 weeks.  No pain or tenderness.  No change in size since patient noticed it 2 weeks ago.  No discharge or bleeding.  3 previous vulvar Bxs benign.    OB History  Gravida Para Term Preterm AB Living  2         2  SAB IAB Ectopic Multiple Live Births               # Outcome Date GA Lbr Len/2nd Weight Sex Delivery Anes PTL Lv  2 Gravida           1 Gravida             Past medical history,surgical history, problem list, medications, allergies, family history and social history were all reviewed and documented in the EPIC chart.   Directed ROS with pertinent positives and negatives documented in the history of present illness/assessment and plan.  Exam:  Vitals:   07/30/21 1156  BP: 116/72   General appearance:  Normal  Gynecologic exam:  Vulva:  Mid vulva on the left inner labia minora with a tiny red dot slightly elevated, pinpoint.   Assessment/Plan:  71 y.o. G2P0   1. Vulvar lesion Noticed a small bump on the left mid vulva x 2 weeks.  No pain or tenderness.  No change in size since patient noticed it 2 weeks ago.  No discharge or bleeding.  3 previous vulvar Bxs benign.  Benign appearance, probable very small hemangioma, will observe.  F/U to reassess in 3 months.  Other orders - OMEPRAZOLE PO; Take by mouth.   Counseling and management of vulvar lesion x 15 minutes.  Princess Bruins MD, 12:36 PM 07/30/2021

## 2021-08-06 ENCOUNTER — Encounter: Payer: Self-pay | Admitting: Obstetrics & Gynecology

## 2021-08-19 DIAGNOSIS — Z1231 Encounter for screening mammogram for malignant neoplasm of breast: Secondary | ICD-10-CM | POA: Diagnosis not present

## 2021-08-20 ENCOUNTER — Encounter: Payer: Self-pay | Admitting: Obstetrics & Gynecology

## 2021-08-26 DIAGNOSIS — L821 Other seborrheic keratosis: Secondary | ICD-10-CM | POA: Diagnosis not present

## 2021-08-26 DIAGNOSIS — D225 Melanocytic nevi of trunk: Secondary | ICD-10-CM | POA: Diagnosis not present

## 2021-09-10 DIAGNOSIS — Z961 Presence of intraocular lens: Secondary | ICD-10-CM | POA: Diagnosis not present

## 2021-10-09 DIAGNOSIS — E042 Nontoxic multinodular goiter: Secondary | ICD-10-CM | POA: Diagnosis not present

## 2021-10-09 DIAGNOSIS — R7303 Prediabetes: Secondary | ICD-10-CM | POA: Diagnosis not present

## 2021-10-09 DIAGNOSIS — E782 Mixed hyperlipidemia: Secondary | ICD-10-CM | POA: Diagnosis not present

## 2021-10-09 DIAGNOSIS — D7589 Other specified diseases of blood and blood-forming organs: Secondary | ICD-10-CM | POA: Diagnosis not present

## 2021-10-16 DIAGNOSIS — M4802 Spinal stenosis, cervical region: Secondary | ICD-10-CM | POA: Diagnosis not present

## 2021-10-16 DIAGNOSIS — Z0000 Encounter for general adult medical examination without abnormal findings: Secondary | ICD-10-CM | POA: Diagnosis not present

## 2021-10-16 DIAGNOSIS — E042 Nontoxic multinodular goiter: Secondary | ICD-10-CM | POA: Diagnosis not present

## 2021-10-16 DIAGNOSIS — K219 Gastro-esophageal reflux disease without esophagitis: Secondary | ICD-10-CM | POA: Diagnosis not present

## 2021-10-16 DIAGNOSIS — E782 Mixed hyperlipidemia: Secondary | ICD-10-CM | POA: Diagnosis not present

## 2021-10-16 DIAGNOSIS — R7303 Prediabetes: Secondary | ICD-10-CM | POA: Diagnosis not present

## 2021-10-29 ENCOUNTER — Ambulatory Visit (INDEPENDENT_AMBULATORY_CARE_PROVIDER_SITE_OTHER): Payer: PPO | Admitting: Obstetrics & Gynecology

## 2021-10-29 ENCOUNTER — Encounter: Payer: Self-pay | Admitting: Obstetrics & Gynecology

## 2021-10-29 VITALS — BP 126/80

## 2021-10-29 DIAGNOSIS — N9089 Other specified noninflammatory disorders of vulva and perineum: Secondary | ICD-10-CM

## 2021-10-29 NOTE — Progress Notes (Unsigned)
    Cynthia Webster 02-Oct-1950 [id number]        71 y.o.  G2P2L2   RP: F/U vulvar lesion at 3 months  HPI: Patient had noticed a small bump on the left mid vulva, on gyn exam, a small hemangioma was present.  No pain or tenderness, no vulvar itching, no discharge or bleeding.  Patient reports mild irritation at the introitus from her cystocele.   H/O 3 benign vulvar Bxs.   OB History  Gravida Para Term Preterm AB Living  2         2  SAB IAB Ectopic Multiple Live Births               # Outcome Date GA Lbr Len/2nd Weight Sex Delivery Anes PTL Lv  2 Gravida           1 Gravida             Past medical history,surgical history, problem list, medications, allergies, family history and social history were all reviewed and documented in the EPIC chart.   Directed ROS with pertinent positives and negatives documented in the history of present illness/assessment and plan.  Exam:  Vitals:   10/29/21 1346  BP: 126/80   General appearance:  Normal  Gynecologic exam: Vulva:  Postmenopausal atrophy.  Scarring and visible small blood vessels around the sites of previous biopsies.  No change x last vulvar exam.     Assessment/Plan:  71 y.o. G2P0   1. Vulvar lesion  Patient had noticed a small bump on the left mid vulva, on gyn exam, a small hemangioma was present.  No pain or tenderness, no vulvar itching, no discharge or bleeding.  Patient reports mild irritation at the introitus from her cystocele.   H/O 3 benign vulvar Bxs. On vulvar exam: Postmenopausal atrophy.  Scarring and visible small blood vessels around the sites of previous biopsies.  No change x last vulvar exam.  Patient reassured.  Decision to observe.  Will reevaluate at Annual Gyn exam in 03/2022.  Princess Bruins MD, 3:15 PM 10/29/2021

## 2021-10-30 ENCOUNTER — Encounter: Payer: Self-pay | Admitting: Obstetrics & Gynecology

## 2021-12-01 ENCOUNTER — Encounter: Payer: Self-pay | Admitting: Obstetrics & Gynecology

## 2021-12-01 ENCOUNTER — Ambulatory Visit (INDEPENDENT_AMBULATORY_CARE_PROVIDER_SITE_OTHER): Payer: PPO | Admitting: Obstetrics & Gynecology

## 2021-12-01 VITALS — BP 126/78 | HR 90 | Resp 18

## 2021-12-01 DIAGNOSIS — N905 Atrophy of vulva: Secondary | ICD-10-CM | POA: Diagnosis not present

## 2021-12-01 NOTE — Progress Notes (Signed)
    Cynthia Webster 11-30-1950 [id number]        71 y.o.  G2P2L2  RP: Vulvar white patches   HPI: Patient was finally able to look at her vulva with a mirror and saw white areas bilaterally at the anterior vulva on 10/30/21.  Had Vulvar biopsies in 2016, 2017 and 2019, all benign.   OB History  Gravida Para Term Preterm AB Living  2         2  SAB IAB Ectopic Multiple Live Births               # Outcome Date GA Lbr Len/2nd Weight Sex Delivery Anes PTL Lv  2 Gravida           1 Gravida             Past medical history,surgical history, problem list, medications, allergies, family history and social history were all reviewed and documented in the EPIC chart.   Directed ROS with pertinent positives and negatives documented in the history of present illness/assessment and plan.  Exam:  Vitals:   12/01/21 1501  BP: 126/78  Pulse: 90  Resp: 18  SpO2: 99%   General appearance:  Normal   Gynecologic exam: Vulva:  Atrophy of menopause throughout the vulva.  Left ant vulva scar from biopsies.  Thin vulvar skin showing the small vessels all around at the fourchette.  Slightly white atrophic vulva bilaterally anteriorly.  Vulva unchanged since last visit.     Assessment/Plan:  71 y.o. G2P0   1. Atrophic vulva Patient was finally able to look at her vulva with a mirror and saw white areas bilaterally at the anterior vulva on 10/30/21.  Had Vulvar biopsies in 2016, 2017 and 2019, all benign.  Benign appearing stable atrophic vulva with scarring from previous vulvar Bxs.  Patient reassured.  Will reevaluate at Annual Gyn exam in 03/2022.  Other orders - FLUAD QUADRIVALENT 0.5 ML injection   Princess Bruins MD, 3:36 PM 12/01/2021

## 2021-12-12 DIAGNOSIS — E042 Nontoxic multinodular goiter: Secondary | ICD-10-CM | POA: Diagnosis not present

## 2021-12-15 DIAGNOSIS — E041 Nontoxic single thyroid nodule: Secondary | ICD-10-CM | POA: Diagnosis not present

## 2021-12-15 DIAGNOSIS — E042 Nontoxic multinodular goiter: Secondary | ICD-10-CM | POA: Diagnosis not present

## 2022-04-02 ENCOUNTER — Ambulatory Visit (INDEPENDENT_AMBULATORY_CARE_PROVIDER_SITE_OTHER): Payer: PPO | Admitting: Obstetrics & Gynecology

## 2022-04-02 ENCOUNTER — Encounter: Payer: Self-pay | Admitting: Obstetrics & Gynecology

## 2022-04-02 VITALS — BP 114/72 | HR 80 | Ht 64.75 in | Wt 169.0 lb

## 2022-04-02 DIAGNOSIS — Z01419 Encounter for gynecological examination (general) (routine) without abnormal findings: Secondary | ICD-10-CM | POA: Diagnosis not present

## 2022-04-02 DIAGNOSIS — Z780 Asymptomatic menopausal state: Secondary | ICD-10-CM

## 2022-04-02 DIAGNOSIS — N905 Atrophy of vulva: Secondary | ICD-10-CM

## 2022-04-02 NOTE — Progress Notes (Signed)
Cynthia Webster Feb 04, 1951 [id number]   History:    72 y.o. G2P2L2 Married   RP:  Established patient presenting for annual gyn exam    HPI: Postmenopause, well on no hormone replacement therapy.  No postmenopausal bleeding.  No pelvic pain. Rectocele symptoms unchanged.  No intercourse.  Pap Neg 03/2020.  No indication for a Pap test at this time.  Left anterior/inner vulvar biopsy 07/2017 was benign.  Urine and bowel movements normal.  Colono 02/2019. Breasts normal. Mammo Neg 08/2021. Body mass index 28.34.  Declines BD.  Health labs with family physician. Flu vaccine at pharmacy.   Past medical history,surgical history, family history and social history were all reviewed and documented in the EPIC chart.  Gynecologic History No LMP recorded. Patient is postmenopausal.  Obstetric History OB History  Gravida Para Term Preterm AB Living  '2 2 2     2  '$ SAB IAB Ectopic Multiple Live Births               # Outcome Date GA Lbr Len/2nd Weight Sex Delivery Anes PTL Lv  2 Term           1 Term              ROS: A ROS was performed and pertinent positives and negatives are included in the history. GENERAL: No fevers or chills. HEENT: No change in vision, no earache, sore throat or sinus congestion. NECK: No pain or stiffness. CARDIOVASCULAR: No chest pain or pressure. No palpitations. PULMONARY: No shortness of breath, cough or wheeze. GASTROINTESTINAL: No abdominal pain, nausea, vomiting or diarrhea, melena or bright red blood per rectum. GENITOURINARY: No urinary frequency, urgency, hesitancy or dysuria. MUSCULOSKELETAL: No joint or muscle pain, no back pain, no recent trauma. DERMATOLOGIC: No rash, no itching, no lesions. ENDOCRINE: No polyuria, polydipsia, no heat or cold intolerance. No recent change in weight. HEMATOLOGICAL: No anemia or easy bruising or bleeding. NEUROLOGIC: No headache, seizures, numbness, tingling or weakness. PSYCHIATRIC: No depression, no loss of interest in normal  activity or change in sleep pattern.     Exam:   BP 114/72   Pulse 80   Ht 5' 4.75" (1.645 m)   Wt 169 lb (76.7 kg)   SpO2 97%   BMI 28.34 kg/m   Body mass index is 28.34 kg/m.  General appearance : Well developed well nourished female. No acute distress HEENT: Eyes: no retinal hemorrhage or exudates,  Neck supple, trachea midline, no carotid bruits, no thyroidmegaly Lungs: Clear to auscultation, no rhonchi or wheezes, or rib retractions  Heart: Regular rate and rhythm, no murmurs or gallops Breast:Examined in sitting and supine position were symmetrical in appearance, no palpable masses or tenderness,  no skin retraction, no nipple inversion, no nipple discharge, no skin discoloration, no axillary or supraclavicular lymphadenopathy Abdomen: no palpable masses or tenderness, no rebound or guarding Extremities: no edema or skin discoloration or tenderness  Pelvic: Vulva: Atrophy, no change             Vagina: No gross lesions or discharge. Rectocele grade 2/3.  Cervix: No gross lesions or discharge  Uterus  AV, normal size, shape and consistency, non-tender and mobile  Adnexa  Without masses or tenderness  Anus: Normal   Assessment/Plan:  72 y.o. female for annual exam   1. Well female exam with routine gynecological exam Postmenopause, well on no hormone replacement therapy.  No postmenopausal bleeding.  No pelvic pain. Rectocele symptoms unchanged.  No intercourse.  Pap Neg 03/2020.  No indication for a Pap test at this time.  Left anterior/inner vulvar biopsy 07/2017 was benign.  Urine and bowel movements normal.  Colono 02/2019. Breasts normal. Mammo Neg 08/2021. Body mass index 28.34.  Declines BD.  Health labs with family physician. Flu vaccine at pharmacy.  2. Postmenopause Postmenopause, well on no hormone replacement therapy.  No postmenopausal bleeding.  No pelvic pain.   3. Atrophic vulva Stable.  Other orders - VITAMIN D PO; Take 5,000 Int'l Units by mouth. occ    Princess Bruins MD, 2:04 PM

## 2022-04-15 DIAGNOSIS — Z86010 Personal history of colonic polyps: Secondary | ICD-10-CM | POA: Diagnosis not present

## 2022-04-15 DIAGNOSIS — Z83719 Family history of colon polyps, unspecified: Secondary | ICD-10-CM | POA: Diagnosis not present

## 2022-04-20 DIAGNOSIS — R7303 Prediabetes: Secondary | ICD-10-CM | POA: Diagnosis not present

## 2022-04-20 DIAGNOSIS — E782 Mixed hyperlipidemia: Secondary | ICD-10-CM | POA: Diagnosis not present

## 2022-04-27 DIAGNOSIS — E782 Mixed hyperlipidemia: Secondary | ICD-10-CM | POA: Diagnosis not present

## 2022-04-27 DIAGNOSIS — R519 Headache, unspecified: Secondary | ICD-10-CM | POA: Diagnosis not present

## 2022-04-27 DIAGNOSIS — J986 Disorders of diaphragm: Secondary | ICD-10-CM | POA: Diagnosis not present

## 2022-04-27 DIAGNOSIS — K219 Gastro-esophageal reflux disease without esophagitis: Secondary | ICD-10-CM | POA: Diagnosis not present

## 2022-04-27 DIAGNOSIS — R7981 Abnormal blood-gas level: Secondary | ICD-10-CM | POA: Diagnosis not present

## 2022-04-27 DIAGNOSIS — R7303 Prediabetes: Secondary | ICD-10-CM | POA: Diagnosis not present

## 2022-04-27 DIAGNOSIS — E042 Nontoxic multinodular goiter: Secondary | ICD-10-CM | POA: Diagnosis not present

## 2022-05-08 DIAGNOSIS — R0602 Shortness of breath: Secondary | ICD-10-CM | POA: Diagnosis not present

## 2022-07-13 ENCOUNTER — Encounter: Payer: Self-pay | Admitting: *Deleted

## 2022-07-14 ENCOUNTER — Ambulatory Visit: Payer: PPO | Admitting: Certified Registered"

## 2022-07-14 ENCOUNTER — Encounter: Payer: Self-pay | Admitting: *Deleted

## 2022-07-14 ENCOUNTER — Encounter
Admission: RE | Disposition: A | Discharge status: Home / Self Care | Payer: Self-pay | Source: Home / Self Care | Attending: Gastroenterology

## 2022-07-14 ENCOUNTER — Ambulatory Visit
Admission: RE | Admit: 2022-07-14 | Discharge: 2022-07-14 | Disposition: A | Discharge status: Home / Self Care | Payer: PPO | Attending: Gastroenterology | Admitting: Gastroenterology

## 2022-07-14 DIAGNOSIS — E7800 Pure hypercholesterolemia, unspecified: Secondary | ICD-10-CM | POA: Insufficient documentation

## 2022-07-14 DIAGNOSIS — Z9889 Other specified postprocedural states: Secondary | ICD-10-CM | POA: Diagnosis not present

## 2022-07-14 DIAGNOSIS — Z1211 Encounter for screening for malignant neoplasm of colon: Secondary | ICD-10-CM | POA: Diagnosis not present

## 2022-07-14 DIAGNOSIS — Z09 Encounter for follow-up examination after completed treatment for conditions other than malignant neoplasm: Secondary | ICD-10-CM | POA: Insufficient documentation

## 2022-07-14 DIAGNOSIS — K635 Polyp of colon: Secondary | ICD-10-CM | POA: Diagnosis not present

## 2022-07-14 DIAGNOSIS — D123 Benign neoplasm of transverse colon: Secondary | ICD-10-CM | POA: Insufficient documentation

## 2022-07-14 DIAGNOSIS — Z800 Family history of malignant neoplasm of digestive organs: Secondary | ICD-10-CM | POA: Insufficient documentation

## 2022-07-14 DIAGNOSIS — Z86010 Personal history of colonic polyps: Secondary | ICD-10-CM | POA: Diagnosis not present

## 2022-07-14 DIAGNOSIS — M8580 Other specified disorders of bone density and structure, unspecified site: Secondary | ICD-10-CM | POA: Insufficient documentation

## 2022-07-14 DIAGNOSIS — Z9049 Acquired absence of other specified parts of digestive tract: Secondary | ICD-10-CM | POA: Diagnosis not present

## 2022-07-14 DIAGNOSIS — K640 First degree hemorrhoids: Secondary | ICD-10-CM | POA: Diagnosis not present

## 2022-07-14 DIAGNOSIS — K649 Unspecified hemorrhoids: Secondary | ICD-10-CM | POA: Diagnosis not present

## 2022-07-14 DIAGNOSIS — D126 Benign neoplasm of colon, unspecified: Secondary | ICD-10-CM | POA: Diagnosis not present

## 2022-07-14 HISTORY — PX: COLONOSCOPY WITH PROPOFOL: SHX5780

## 2022-07-14 SURGERY — COLONOSCOPY WITH PROPOFOL
Anesthesia: General

## 2022-07-14 MED ORDER — PROPOFOL 10 MG/ML IV BOLUS
INTRAVENOUS | Status: DC | PRN
  Administered 2022-07-14: 30 mg via INTRAVENOUS
  Administered 2022-07-14: 70 mg via INTRAVENOUS

## 2022-07-14 MED ORDER — PROPOFOL 500 MG/50ML IV EMUL
INTRAVENOUS | Status: DC | PRN
  Administered 2022-07-14: 120 ug/kg/min via INTRAVENOUS

## 2022-07-14 MED ORDER — LIDOCAINE 2% (20 MG/ML) 5 ML SYRINGE
INTRAMUSCULAR | Status: DC | PRN
  Administered 2022-07-14: 20 mg via INTRAVENOUS

## 2022-07-14 MED ORDER — SODIUM CHLORIDE 0.9 % IV SOLN: INTRAVENOUS | Status: DC

## 2022-07-14 NOTE — H&P (Signed)
Outpatient short stay form Pre-procedure 07/14/2022  Regis Bill, MD  Primary Physician: Patrice Paradise, MD  Reason for visit:  Surveillance  History of present illness:    72 y/o lady with history of arthritis and HLD here for surveillance colonoscopy. No blood thinners. Brother had colon cancer at age 81. Patient's last colonoscopy was in 2020 with hyperplastic polyp but had large polyps removed in 2019 that were SSA's and adenoma's. History of cholecystectomy.    Current Facility-Administered Medications:    0.9 %  sodium chloride infusion, , Intravenous, Continuous, Eri Platten, Rossie Muskrat, MD, Last Rate: 20 mL/hr at 07/14/22 0844, New Bag at 07/14/22 0844  Medications Prior to Admission  Medication Sig Dispense Refill Last Dose   Multiple Vitamin (MULTIVITAMIN) tablet Take 1 tablet by mouth.   Past Week   simvastatin (ZOCOR) 10 MG tablet Take 10 mg by mouth daily.   Past Week   VITAMIN D PO Take 5,000 Int'l Units by mouth. occ   Past Week     No Known Allergies   Past Medical History:  Diagnosis Date   Cataract of both eyes    DDD (degenerative disc disease), cervical    DDD (degenerative disc disease), thoracic    Gastritis    Hypercholesteremia    Osteopenia    Pelvic prolapse    Personal history of colonic polyps    Pulmonary nodule    Rectocele     Review of systems:  Otherwise negative.    Physical Exam  Gen: Alert, oriented. Appears stated age.  HEENT: PERRLA. Lungs: No respiratory distress CV: RRR Abd: soft, benign, no masses Ext: No edema    Planned procedures: Proceed with colonoscopy. The patient understands the nature of the planned procedure, indications, risks, alternatives and potential complications including but not limited to bleeding, infection, perforation, damage to internal organs and possible oversedation/side effects from anesthesia. The patient agrees and gives consent to proceed.  Please refer to procedure notes for  findings, recommendations and patient disposition/instructions.     Regis Bill, MD [facility]

## 2022-07-14 NOTE — Interval H&P Note (Signed)
History and Physical Interval Note:  07/14/2022 8:55 AM  Cynthia Webster  has presented today for surgery, with the diagnosis of H/O TA Polyps of Colon.  The various methods of treatment have been discussed with the patient and family. After consideration of risks, benefits and other options for treatment, the patient has consented to  Procedure(s): COLONOSCOPY WITH PROPOFOL (N/A) as a surgical intervention.  The patient's history has been reviewed, patient examined, no change in status, stable for surgery.  I have reviewed the patient's chart and labs.  Questions were answered to the patient's satisfaction.     Regis Bill  Ok to proceed with colonoscopy

## 2022-07-14 NOTE — Anesthesia Postprocedure Evaluation (Signed)
Anesthesia Post Note  Patient: Cynthia Webster  Procedure(s) Performed: COLONOSCOPY WITH PROPOFOL  Patient location during evaluation: Endoscopy Anesthesia Type: General Level of consciousness: awake and alert Pain management: pain level controlled Vital Signs Assessment: post-procedure vital signs reviewed and stable Respiratory status: spontaneous breathing, nonlabored ventilation, respiratory function stable and patient connected to nasal cannula oxygen Cardiovascular status: blood pressure returned to baseline and stable Postop Assessment: no apparent nausea or vomiting Anesthetic complications: no   No notable events documented.   Last Vitals:  Vitals:   07/14/22 0829 07/14/22 0914  BP: (!) 145/99 109/61  Pulse: 96 98  Resp: 18 (!) 25  Temp: (!) 36.2 C (!) 36.1 C  SpO2: 99% 99%    Last Pain:  Vitals:   07/14/22 0934  TempSrc:   PainSc: 0-No pain                 Corinda Gubler

## 2022-07-14 NOTE — Op Note (Signed)
[facility] Gastroenterology Patient Name: Cynthia Webster Procedure Date: 07/14/2022 8:47 AM MRN: [id number] Account #: [id number] Date of Birth: February 05, 1951 Admit Type: Outpatient Age: 72 Room: [facility] ENDO ROOM 1 Gender: Female Note Status: Finalized Instrument Name: Prentice Docker [id number] Procedure:             Colonoscopy Indications:           Surveillance: Personal history of adenomatous polyps                         on last colonoscopy > 3 years ago Providers:             Eather Colas MD, MD Referring MD:          Eather Colas MD, MD (Referring MD) Medicines:             Monitored Anesthesia Care Complications:         No immediate complications. Estimated blood loss:                         Minimal. Procedure:             Pre-Anesthesia Assessment:                        - Prior to the procedure, a History and Physical was                         performed, and patient medications and allergies were                         reviewed. The patient is competent. The risks and                         benefits of the procedure and the sedation options and                         risks were discussed with the patient. All questions                         were answered and informed consent was obtained.                         Patient identification and proposed procedure were                         verified by the physician, the nurse, the                         anesthesiologist, the anesthetist and the technician                         in the endoscopy suite. Mental Status Examination:                         alert and oriented. Airway Examination: normal                         oropharyngeal airway and neck mobility. Respiratory  Examination: clear to auscultation. CV Examination:                         normal. Prophylactic Antibiotics: The patient does not                         require prophylactic antibiotics. Prior                          Anticoagulants: The patient has taken no anticoagulant                         or antiplatelet agents. ASA Grade Assessment: II - A                         patient with mild systemic disease. After reviewing                         the risks and benefits, the patient was deemed in                         satisfactory condition to undergo the procedure. The                         anesthesia plan was to use monitored anesthesia care                         (MAC). Immediately prior to administration of                         medications, the patient was re-assessed for adequacy                         to receive sedatives. The heart rate, respiratory                         rate, oxygen saturations, blood pressure, adequacy of                         pulmonary ventilation, and response to care were                         monitored throughout the procedure. The physical                         status of the patient was re-assessed after the                         procedure.                        After obtaining informed consent, the colonoscope was                         passed under direct vision. Throughout the procedure,                         the patient's blood pressure, pulse, and oxygen  saturations were monitored continuously. The                         Colonoscope was introduced through the anus and                         advanced to the the cecum, identified by appendiceal                         orifice and ileocecal valve. The colonoscopy was                         performed without difficulty. The patient tolerated                         the procedure well. The quality of the bowel                         preparation was good. The ileocecal valve, appendiceal                         orifice, and rectum were photographed. Findings:      The perianal and digital rectal examinations were normal.      A post polypectomy scar was found in  the transverse colon. There was       residual polypoid tissue. The polyp was removed with a cold snare.       Resection and retrieval were complete. Estimated blood loss was minimal.      Internal hemorrhoids were found during retroflexion. The hemorrhoids       were Grade I (internal hemorrhoids that do not prolapse).      The exam was otherwise without abnormality on direct and retroflexion       views. Impression:            - Post-polypectomy scar in the transverse colon.                        - Internal hemorrhoids.                        - The examination was otherwise normal on direct and                         retroflexion views. Recommendation:        - Discharge patient to home.                        - Resume previous diet.                        - Continue present medications.                        - Await pathology results.                        - Repeat colonoscopy in 5 years for surveillance if                         benefits outweigh risks.                        -  Return to referring physician as previously                         scheduled. Procedure Code(s):     --- Professional ---                        [phone], Colonoscopy, flexible; with removal of                         tumor(s), polyp(s), or other lesion(s) by snare                         technique Diagnosis Code(s):     --- Professional ---                        Z86.010, Personal history of colonic polyps                        Z98.890, Other specified postprocedural states                        K64.0, First degree hemorrhoids CPT copyright 2022 American [employer] Association. All rights reserved. The codes documented in this report are preliminary and upon coder review may  be revised to meet current compliance requirements. Eather Colas MD, MD 07/14/2022 9:25:18 AM Number of Addenda: 0 Note Initiated On: 07/14/2022 8:47 AM Scope Withdrawal Time: 0 hours 7 minutes 16 seconds  Total Procedure  Duration: 0 hours 10 minutes 21 seconds  Estimated Blood Loss:  Estimated blood loss was minimal.      [facility]

## 2022-07-14 NOTE — Anesthesia Preprocedure Evaluation (Signed)
Anesthesia Evaluation  Patient identified by MRN, date of birth, ID band Patient awake    Reviewed: Allergy & Precautions, NPO status , Patient's Chart, lab work & pertinent test results  History of Anesthesia Complications Negative for: history of anesthetic complications  Airway Mallampati: III  TM Distance: <3 FB Neck ROM: Full    Dental no notable dental hx. (+) Teeth Intact   Pulmonary neg pulmonary ROS, neg sleep apnea, neg COPD, Patient abstained from smoking.Not current smoker   Pulmonary exam normal breath sounds clear to auscultation       Cardiovascular Exercise Tolerance: Good METS(-) hypertension(-) CAD and (-) Past MI negative cardio ROS (-) dysrhythmias  Rhythm:Regular Rate:Normal - Systolic murmurs    Neuro/Psych negative neurological ROS  negative psych ROS   GI/Hepatic ,neg GERD  ,,(+)     (-) substance abuse    Endo/Other  neg diabetes    Renal/GU negative Renal ROS     Musculoskeletal   Abdominal   Peds  Hematology   Anesthesia Other Findings Past Medical History: No date: Cataract of both eyes 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), 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), thoracic No date: Gastritis No date: Hypercholesteremia No date: Osteopenia No date: Pelvic prolapse No date: Personal history of colonic polyps No date: Pulmonary nodule No date: Rectocele  Reproductive/Obstetrics                             Anesthesia Physical Anesthesia Plan  ASA: 2  Anesthesia Plan: General   Post-op Pain Management: Minimal or no pain anticipated   Induction: Intravenous  PONV Risk Score and Plan: 3 and Propofol infusion, TIVA and Ondansetron  Airway Management Planned: Nasal Cannula  Additional Equipment: None  Intra-op Plan:   Post-operative Plan:   Informed Consent: I have reviewed the patients History and Physical, chart, labs and  discussed the procedure including the risks, benefits and alternatives for the proposed anesthesia with the patient or authorized representative who has indicated his/her understanding and acceptance.     Dental advisory given  Plan Discussed with: CRNA and Surgeon  Anesthesia Plan Comments: (Discussed risks of anesthesia with patient, including possibility of difficulty with spontaneous ventilation under anesthesia necessitating airway intervention, PONV, and rare risks such as cardiac or respiratory or neurological events, and allergic reactions. Discussed the role of CRNA in patient's perioperative care. Patient understands.)       Anesthesia Quick Evaluation

## 2022-07-14 NOTE — Transfer of Care (Signed)
Immediate Anesthesia Transfer of Care Note  Patient: Cynthia Webster  Procedure(s) Performed: COLONOSCOPY WITH PROPOFOL  Patient Location: Endoscopy Unit  Anesthesia Type:General  Level of Consciousness: awake and alert   Airway & Oxygen Therapy: Patient Spontanous Breathing  Post-op Assessment: Report given to RN and Post -op Vital signs reviewed and stable  Post vital signs: Reviewed  Last Vitals:  Vitals Value Taken Time  BP 109/61 07/14/22 0914  Temp 36.1 C 07/14/22 0914  Pulse 99 07/14/22 0914  Resp 28 07/14/22 0914  SpO2 98 % 07/14/22 0914  Vitals shown include unvalidated device data.  Last Pain:  Vitals:   07/14/22 0914  TempSrc: Temporal  PainSc: 0-No pain         Complications: No notable events documented.

## 2022-07-15 ENCOUNTER — Encounter: Payer: Self-pay | Admitting: Gastroenterology

## 2022-07-15 LAB — SURGICAL PATHOLOGY

## 2022-08-25 DIAGNOSIS — Z1231 Encounter for screening mammogram for malignant neoplasm of breast: Secondary | ICD-10-CM | POA: Diagnosis not present

## 2022-08-28 ENCOUNTER — Encounter: Payer: Self-pay | Admitting: Obstetrics & Gynecology

## 2022-10-01 DIAGNOSIS — H35372 Puckering of macula, left eye: Secondary | ICD-10-CM | POA: Diagnosis not present

## 2022-10-01 DIAGNOSIS — H47323 Drusen of optic disc, bilateral: Secondary | ICD-10-CM | POA: Diagnosis not present

## 2022-10-19 DIAGNOSIS — K219 Gastro-esophageal reflux disease without esophagitis: Secondary | ICD-10-CM | POA: Diagnosis not present

## 2022-10-19 DIAGNOSIS — E782 Mixed hyperlipidemia: Secondary | ICD-10-CM | POA: Diagnosis not present

## 2022-10-19 DIAGNOSIS — E042 Nontoxic multinodular goiter: Secondary | ICD-10-CM | POA: Diagnosis not present

## 2022-10-19 DIAGNOSIS — R519 Headache, unspecified: Secondary | ICD-10-CM | POA: Diagnosis not present

## 2022-10-19 DIAGNOSIS — R7981 Abnormal blood-gas level: Secondary | ICD-10-CM | POA: Diagnosis not present

## 2022-10-19 DIAGNOSIS — R7303 Prediabetes: Secondary | ICD-10-CM | POA: Diagnosis not present

## 2022-10-21 DIAGNOSIS — M75102 Unspecified rotator cuff tear or rupture of left shoulder, not specified as traumatic: Secondary | ICD-10-CM | POA: Diagnosis not present

## 2022-10-21 DIAGNOSIS — M25512 Pain in left shoulder: Secondary | ICD-10-CM | POA: Diagnosis not present

## 2022-10-21 DIAGNOSIS — M4802 Spinal stenosis, cervical region: Secondary | ICD-10-CM | POA: Diagnosis not present

## 2022-11-14 IMAGING — MR MR HEAD WO/W CM
15 series · 48 of 48 positions shown · IV contrast (gadavist)
Comparison: None.

CLINICAL DATA: Chronic morning headache

EXAM:
MRI HEAD WITHOUT AND WITH CONTRAST
TECHNIQUE: Multiplanar, multiecho pulse sequences of the brain and surrounding
structures were obtained without and with intravenous contrast.
CONTRAST:  7.5mL GADAVIST GADOBUTROL 1 MMOL/ML IV SOLN

[Series 5: ax dwi_tracew · axial · 3.0mm · 0.65mm/px · z∈[-34,+118]mm · 3 of 48 slices shown]
[im 1/48]
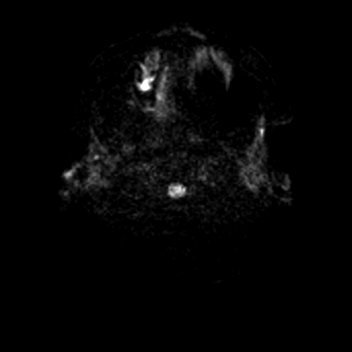
[im 24/48]
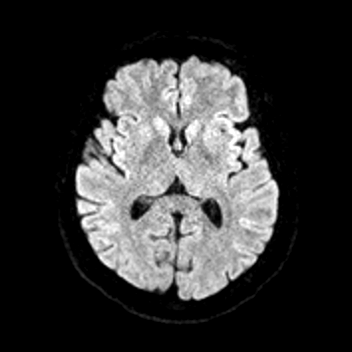
[im 48/48]
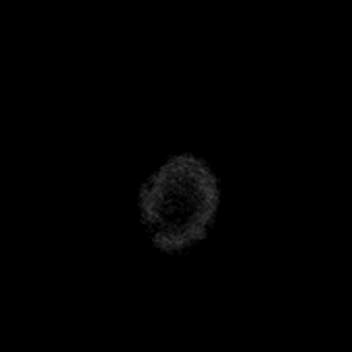

[Series 6: ax dwi_adc · axial · 3.0mm · 0.65mm/px · z∈[-34,+118]mm · 2 of 48 slices shown]
[im 1/48]
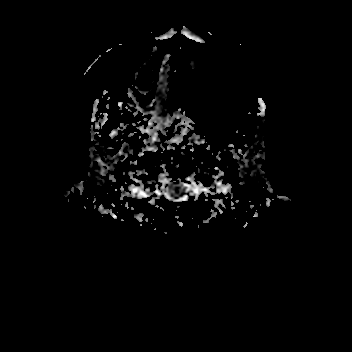
[im 48/48]
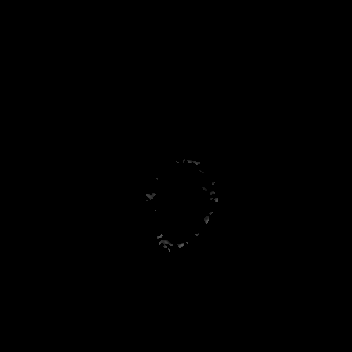

[Series 7: cor dwi_tracew · coronal · 5.0mm · 0.60mm/px · 2 of 38 slices shown]
[im 1/38]
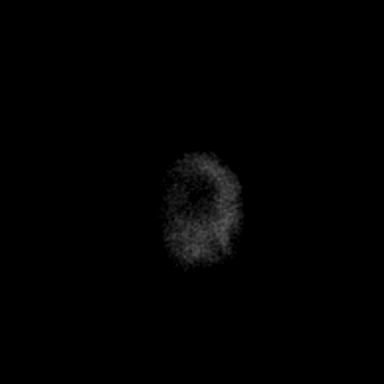
[im 38/38]
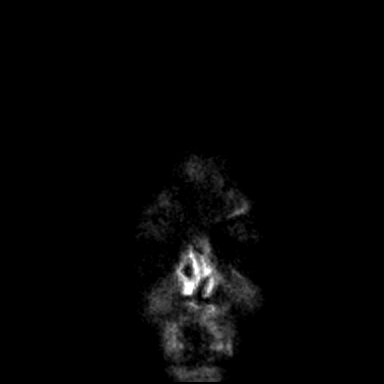

[Series 8: cor dwi_adc · coronal · 5.0mm · 0.60mm/px · 2 of 38 slices shown]
[im 1/38]
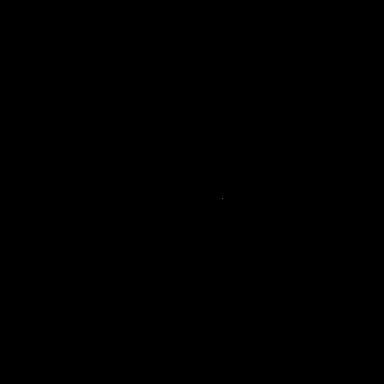
[im 38/38]
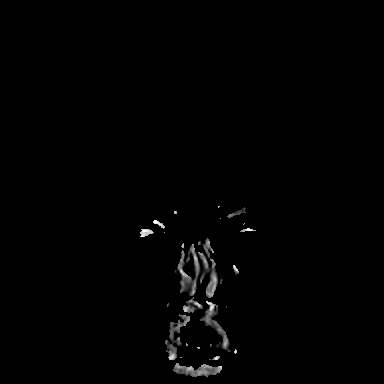

[Series 9: T1 · sagittal · 5.0mm · 0.62mm/px · 1 of 25 slices shown (1 of 2)]
[im 1/25]
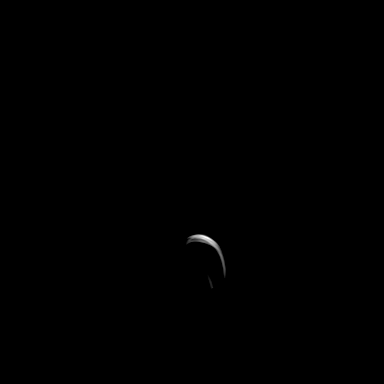

[Series 10: T2 · axial · 5.0mm · 0.53mm/px · 1 of 25 slices shown]
[im 1/25]
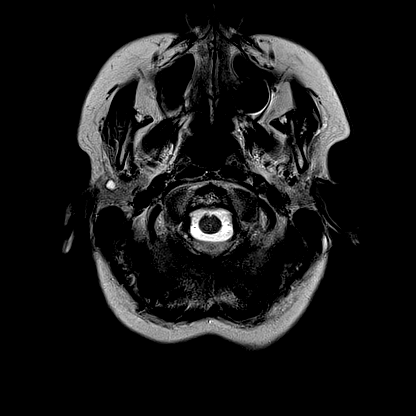

[Series 11: ax swi_mag · axial · 2.0mm · 0.90mm/px · z∈[-35,+120]mm · 4 of 80 slices shown]
[im 1/80]
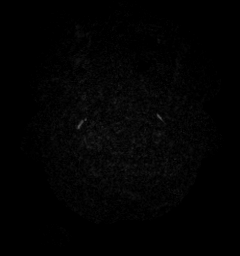
[im 27/80]
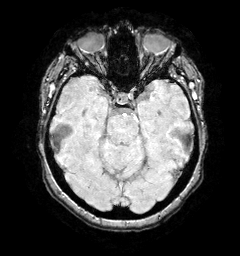
[im 53/80]
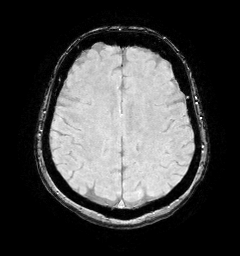
[im 80/80]
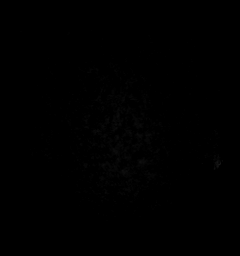

[Series 12: ax swi_pha · axial · 2.0mm · 0.90mm/px · z∈[-35,+120]mm · 4 of 80 slices shown]
[im 1/80]
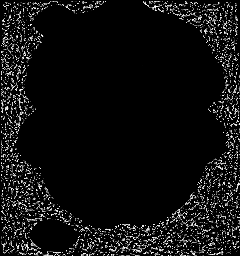
[im 27/80]
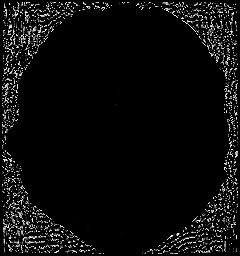
[im 53/80]
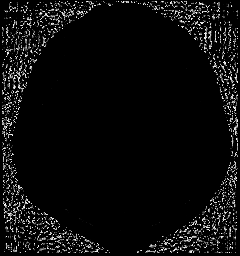
[im 80/80]
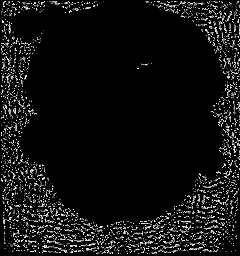

[Series 13: ax swi_swi · axial · 2.0mm · 0.90mm/px · z∈[-35,+120]mm · 4 of 80 slices shown]
[im 1/80]
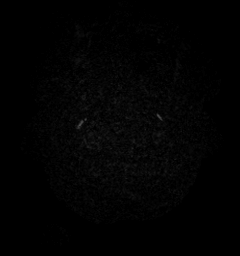
[im 27/80]
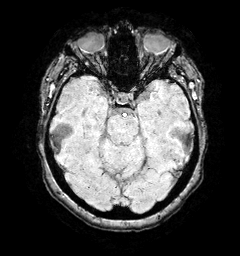
[im 53/80]
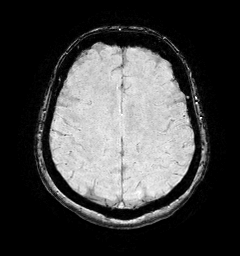
[im 80/80]
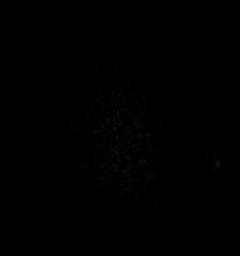

[Series 14: ax swi_swi_mip · axial · 16.0mm · 0.90mm/px · z∈[-28,+113]mm · 4 of 73 slices shown]
[im 1/73]
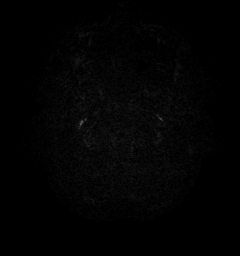
[im 25/73]
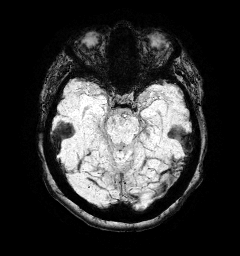
[im 49/73]
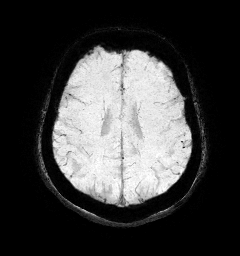
[im 73/73]
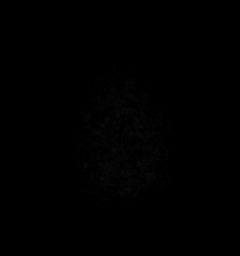

[Series 15: FLAIR · axial · 3.0mm · 0.53mm/px · z∈[-38,+121]mm · 3 of 55 slices shown]
[im 1/55]
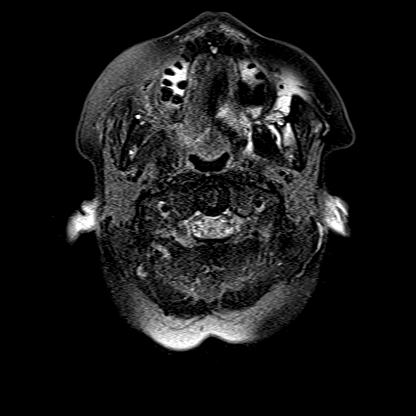
[im 28/55]
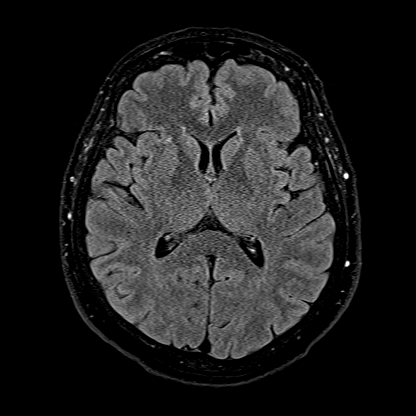
[im 55/55]
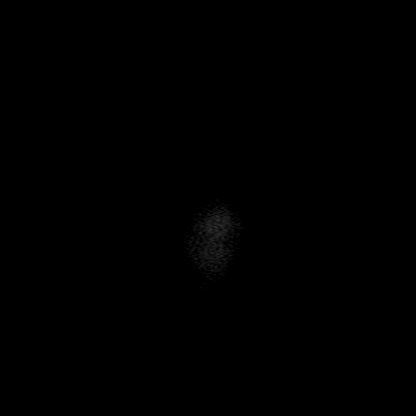

[Series 16: T1 · axial · 1.0mm · 0.98mm/px · z∈[-42,+130]mm · 8 of 176 slices shown (2 of 2)]
[im 1/176]
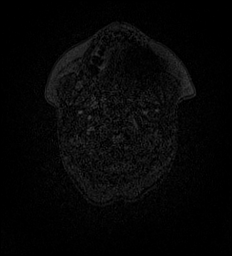
[im 26/176]
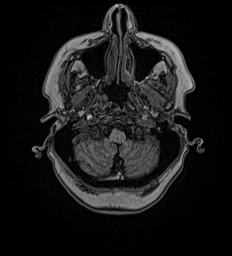
[im 51/176]
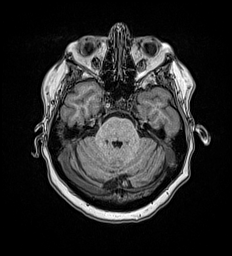
[im 76/176]
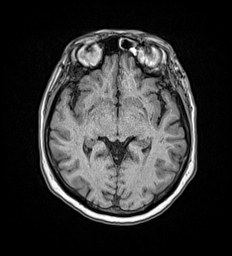
[im 101/176]
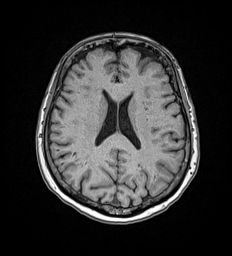
[im 126/176]
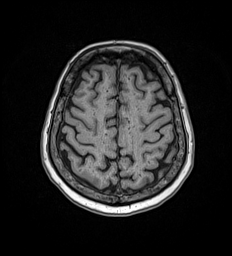
[im 151/176]
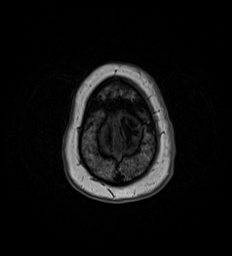
[im 176/176]
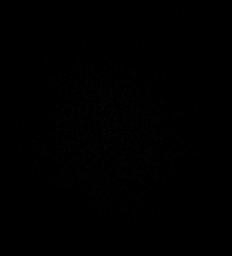

[Series 22: T2 post-contrast · coronal · 5.0mm · 0.57mm/px · 1 of 29 slices shown]
[im 1/29]
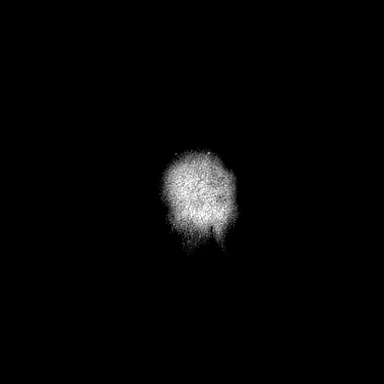

[Series 23: T1 post-contrast · axial · 1.0mm · 0.98mm/px · z∈[-42,+130]mm · 8 of 176 slices shown (1 of 2)]
[im 1/176]
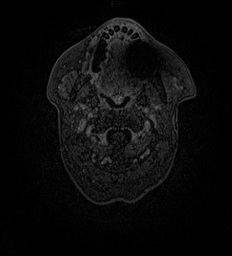
[im 26/176]
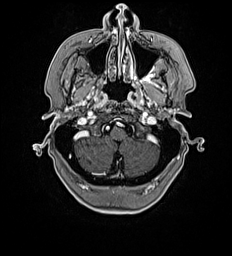
[im 51/176]
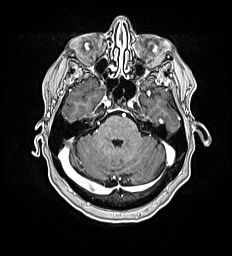
[im 76/176]
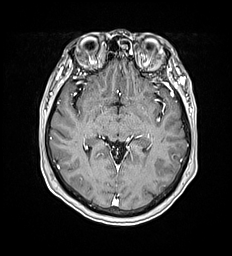
[im 101/176]
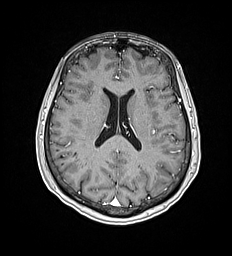
[im 126/176]
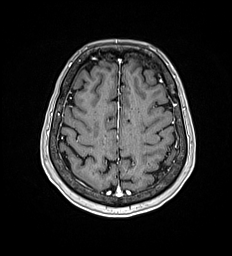
[im 151/176]
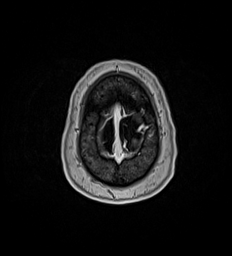
[im 176/176]
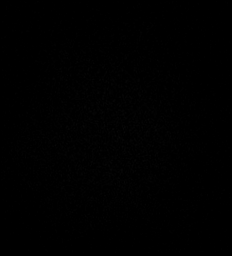

[Series 24: T1 post-contrast · coronal · 5.0mm · 0.57mm/px · 1 of 29 slices shown (2 of 2)]
[im 1/29]
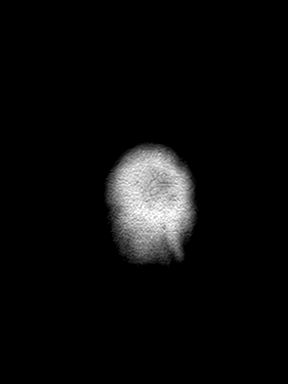

[48 of 48 positions shown; findings below may reference images not displayed]

FINDINGS: Brain: There is no acute infarction or intracranial hemorrhage.
There is no intracranial mass, mass effect, or edema. There is no
hydrocephalus or extra-axial fluid collection. Ventricles and sulci
are normal in size and configuration. No abnormal enhancement.

Vascular: Major vessel flow voids at the skull base are preserved.

Skull and upper cervical spine: Normal marrow signal is preserved.

Sinuses/Orbits: Paranasal sinuses are aerated. Bilateral lens
replacements.

Other: Sella is unremarkable.  Mastoid air cells are clear.
IMPRESSION: No intracranial mass, hemorrhage, abnormal enhancement, or other
significant abnormality.

## 2022-11-23 DIAGNOSIS — M25512 Pain in left shoulder: Secondary | ICD-10-CM | POA: Diagnosis not present

## 2022-11-23 DIAGNOSIS — M4802 Spinal stenosis, cervical region: Secondary | ICD-10-CM | POA: Diagnosis not present

## 2022-11-23 DIAGNOSIS — K219 Gastro-esophageal reflux disease without esophagitis: Secondary | ICD-10-CM | POA: Diagnosis not present

## 2022-11-23 DIAGNOSIS — Z0000 Encounter for general adult medical examination without abnormal findings: Secondary | ICD-10-CM | POA: Diagnosis not present

## 2022-11-23 DIAGNOSIS — G8929 Other chronic pain: Secondary | ICD-10-CM | POA: Diagnosis not present

## 2022-11-23 DIAGNOSIS — R7303 Prediabetes: Secondary | ICD-10-CM | POA: Diagnosis not present

## 2022-11-23 DIAGNOSIS — J986 Disorders of diaphragm: Secondary | ICD-10-CM | POA: Diagnosis not present

## 2022-11-23 DIAGNOSIS — E042 Nontoxic multinodular goiter: Secondary | ICD-10-CM | POA: Diagnosis not present

## 2022-11-23 DIAGNOSIS — E782 Mixed hyperlipidemia: Secondary | ICD-10-CM | POA: Diagnosis not present

## 2023-04-07 DIAGNOSIS — E042 Nontoxic multinodular goiter: Secondary | ICD-10-CM | POA: Diagnosis not present

## 2023-07-26 ENCOUNTER — Encounter: Payer: Self-pay | Admitting: Obstetrics and Gynecology

## 2023-07-26 ENCOUNTER — Ambulatory Visit (INDEPENDENT_AMBULATORY_CARE_PROVIDER_SITE_OTHER): Admitting: Obstetrics and Gynecology

## 2023-07-26 VITALS — BP 132/74 | HR 82 | Wt 165.4 lb

## 2023-07-26 DIAGNOSIS — N816 Rectocele: Secondary | ICD-10-CM | POA: Diagnosis not present

## 2023-07-26 DIAGNOSIS — N905 Atrophy of vulva: Secondary | ICD-10-CM | POA: Diagnosis not present

## 2023-07-26 NOTE — Progress Notes (Signed)
 GYNECOLOGY  VISIT   HPI: 73 y.o.   Married  Caucasian  female   G2P2002 with No LMP recorded. Patient is postmenopausal.   here for vulvar check.  She states she has plaques of the vulva.  Also has rectocele protruding outside of the vagina.  Not pursuing tx options.  Used a pessary in the past, which did not work well per patient.   Not doing any treatment for the vulvar skin change. Did a short term treatment tx with Clobetasol in the distant past.   Some itching.  No burning.   Vulvar biopsy 08/12/17 showing papillomatous skin with hyperkeratosis suggestive of verrucoid change.  No dysplasia or malignancy.  Prior vulva biopsies in 2016 of left anterior vulva showing squamous hyperplasia and reactive atypia and 2017 showing hyperkertosis negative for dysplasia.  Declines vaginal estradiol cream.  Hx benign breast biopsies.   GYNECOLOGIC HISTORY: No LMP recorded. Patient is postmenopausal. Contraception:  pmp Menopausal hormone therapy:  none  Last mammogram:  08/25/22 Bi-rads 1 neg  Last pap smear:   03/26/20         OB History     Gravida  2   Para  2   Term  2   Preterm      AB      Living  2      SAB      IAB      Ectopic      Multiple      Live Births                 There are no active problems to display for this patient.   Past Medical History:  Diagnosis Date   Cataract of both eyes    DDD (degenerative disc disease), cervical    DDD (degenerative disc disease), thoracic    Gastritis    Hypercholesteremia    Osteopenia    Pelvic prolapse    Personal history of colonic polyps    Pulmonary nodule    Rectocele     Past Surgical History:  Procedure Laterality Date   BIOPSY THYROID      BREAST SURGERY Right    CATARACT EXTRACTION W/PHACO Right 08/23/2018   Procedure: CATARACT EXTRACTION PHACO AND INTRAOCULAR LENS PLACEMENT (IOC)  RIGHT;  Surgeon: Clair Crews, MD;  [facility];  Service: Ophthalmology;   Laterality: Right;   CHOLECYSTECTOMY     COLON SURGERY     COLONOSCOPY WITH PROPOFOL  N/A 12/03/2017   Procedure: COLONOSCOPY WITH PROPOFOL ;  Surgeon: Cassie Click, MD;  Location: [facility] ENDOSCOPY;  Service: Endoscopy;  Laterality: N/A;   COLONOSCOPY WITH PROPOFOL  N/A 02/17/2019   Procedure: COLONOSCOPY WITH PROPOFOL ;  Surgeon: Marshall Skeeter, MD;  Location: [facility] ENDOSCOPY;  Service: Endoscopy;  Laterality: N/A;   COLONOSCOPY WITH PROPOFOL  N/A 07/14/2022   Procedure: COLONOSCOPY WITH PROPOFOL ;  Surgeon: Shane Darling, MD;  Location: [facility] ENDOSCOPY;  Service: Endoscopy;  Laterality: N/A;   EYE SURGERY     RADIOACTIVE SEED GUIDED EXCISIONAL BREAST BIOPSY Right 11/06/2020   Procedure: RIGHT BREAST RADIOACTIVE SEED GUIDED EXCISIONAL BREAST BIOPSY;  Surgeon: Lockie Rima, MD;  Location: [facility];  Service: General;  Laterality: Right;   TONSILLECTOMY      Current Outpatient Medications  Medication Sig Dispense Refill   Multiple Vitamin (MULTIVITAMIN) tablet Take 1 tablet by mouth.     simvastatin (ZOCOR) 10 MG tablet Take 10 mg by mouth daily.     VITAMIN D PO Take  5,000 Int'l Units by mouth. occ     No current facility-administered medications for this visit.     ALLERGIES: Patient has no known allergies.  Family History  Problem Relation Age of Onset   Hypertension Mother    Stroke Father    Diabetes Paternal Grandmother     Social History   Socioeconomic History   Marital status: Married    Spouse name: Not on file   Number of children: Not on file   Years of education: Not on file   Highest education level: Not on file  Occupational History   Not on file  Tobacco Use   Smoking status: Never   Smokeless tobacco: Never  Vaping Use   Vaping status: Never Used  Substance and Sexual Activity   Alcohol use: Not Currently    Comment: SOCIAL DRINK   Drug use: No   Sexual activity: Not Currently    Partners: Male    Birth control/protection:  Post-menopausal, Abstinence    Comment: older than 16, less than 5  Other Topics Concern   Not on file  Social History Narrative   Not on file   Social Drivers of Health   Financial Resource Strain: Low Risk  (04/07/2023)   Received from [facility]   Overall Financial Resource Strain (CARDIA)    Difficulty of Paying Living Expenses: Not hard at all  Food Insecurity: No Food Insecurity (04/07/2023)   Received from [facility]   Hunger Vital Sign    Worried About Running Out of Food in the Last Year: Never true    Ran Out of Food in the Last Year: Never true  Transportation Needs: No Transportation Needs (04/07/2023)   Received from [facility] - Transportation    In the past 12 months, has lack of transportation kept you from medical appointments or from getting medications?: No    Lack of Transportation (Non-Medical): No  Physical Activity: Not on file  Stress: Not on file  Social Connections: Not on file  Intimate Partner Violence: Not on file    Review of Systems  All other systems reviewed and are negative.   PHYSICAL EXAMINATION:    BP 132/74   Pulse 82   Wt 165 lb 6.4 oz (75 kg)   SpO2 98%   BMI 27.74 kg/m     General appearance: alert, cooperative and appears stated age   Pelvic: External genitalia:  purple coloration of the skin below the clitoris.  Scar formation.  No raised lesions.  Blood vessels visible.                Urethra:  normal appearing urethra with no masses, tenderness or lesions              Bartholins and Skenes: normal                 Vagina: normal appearing vagina with normal color and discharge, no lesions.  Third degree rectocele.  Atrophy noted along the posterior introitus also.               Cervix: no lesions                Bimanual Exam:  Uterus:  normal size, contour, position, consistency, mobility, non-tender              Adnexa: no mass, fullness, tenderness             Chaperone  was present for exam:  Cottie Diss, CMA. Mirror used to examine the patient so we could evaluate the areas together.   ASSESSMENT  Vulvar atrophy.  Status post vulvar biopsy x 3.  All benign.  Third degree rectocele.   PLAN  No biopsy needed at this time.  Patient will focus on hydrating treatments:  Ky Jelly, Aquafor, coconut oil.  She declines vaginal estrogen.  We briefly discussed rectocele repair.  If she desires this, would refer to [facility] urogyn.  Return for breast and pelvic exam in January, 2026 and return prn.   35 min  total time was spent for this patient encounter, including preparation, face-to-face counseling with the patient, coordination of care, and documentation of the encounter.

## 2023-07-26 NOTE — Patient Instructions (Signed)
 About Rectocele  Overview  A rectocele is a type of hernia which causes different degrees of bulging of the rectal tissues into the vaginal wall.  You may even notice that it presses against the vaginal wall so much that some vaginal tissues droop outside of the opening of your vagina.  Causes of Rectocele  The most common cause is childbirth.  The muscles and ligaments in the pelvis that hold up and support the female organs and vagina become stretched and weakened during labor and delivery.  The more babies you have, the more the support tissues are stretched and weakened.  Not everyone who has a baby will develop a rectocele.  Some women have stronger supporting tissue in the pelvis and may not have as much of a problem as others.  Women who have a Cesarean section usually do not get rectocele's unless they pushed a long time prior to the cesarean delivery.  Other conditions that can cause a rectocele include chronic constipation, a chronic cough, a lot of heavy lifting, and obesity.  Older women may have this problem because the loss of female hormones causes the vaginal tissue to become weaker.  Symptoms  There may not be any symptoms.  If you do have symptoms, they may include: Pelvic pressure in the rectal area Protrusion of the lower part of the vagina through the opening of the vagina Constipation and trapping of the stool, making it difficult to have a bowel movement.  In severe cases, you may have to press on the lower part of your vagina to help push the stool out of you rectum.  This is called splinting to empty.  Diagnosing Rectocele  Your health care provider will ask about your symptoms and perform a pelvic exam.  S/he will ask you to bear down, pushing like you are having a bowel movement so as to see how far the lower part of the vagina protrudes into the vagina and possible outside of the vagina.  Your provider will also ask you to contract the muscles of your pelvis (like you  are stopping the stream in the middle of urinating) to determine the strength of your pelvic muscles.  Your provider may also do a rectal exam.  Treatment Options  If you do not have any symptoms, no treatment may be necessary.  Other treatment options include: Pelvic floor exercises: Contracting the muscles in your genital area may help strengthen your muscles and support the organs.  Be sure to get proper exercise instruction from you physical therapist. A pessary (removealbe pelvic support device) sometimes helps rectocele symptoms. Surgery: Surgical repair may be necessary. In some cases the uterus may need to be taken out ( a hysterectomy) as well.  There are many types of surgery for pelvic support problems.  Look for physicians who specialize in repair procedures.  You can take care of yourself by: Treating and preventing constipation Avoiding heavy lifting, and lifting correctly (with your legs, not with you waist or back) Treating a chronic cough or bronchitis Not smoking avoiding too much weight gain Doing pelvic floor exercises   2007, [employer] Doc.33  Vaginal Dryness and Thinning (Atrophic Vaginitis): What to Know  Atrophic vaginitis is when the lining of the vagina becomes dry, thin, and inflamed. Tell your doctor if you notice any changes in your vagina. They can help you find ways to feel better. They can also treat infections and suggest healthy habits for a healthy vagina. What are the causes? This  condition is caused by a drop in estrogen. This affects the moisture in the lining of your vagina. When estrogen levels are low, the tissues become dry. This is most common when menstrual periods stop during menopause. What increases the risk? You're more likely to get this condition if: You take medicines that block estrogen. You've had your ovaries taken out. You're being treated for cancer. You recently had a baby. You're breastfeeding. You're over 8  years old. You have an eating disorder. You smoke. What are the signs or symptoms? Pain during sex. Soreness during sex. Bleeding during sex. Burning or itching around your vagina. Loss of interest in sex. Burning pain when you pee. Peeing often. Fluid coming from your vagina that isn't normal. It may be: White. Martina Sledge. Yellow. Tinted with blood. Thick. Watery. Some people don't have symptoms. How is this treated? Using a lubricant before sex. Using a moisturizer in the vagina. Using estrogen in the vagina. You may not need treatment if your symptoms are mild or you're not having sex. Follow these instructions at home: Medicines Take your medicines only as told. Do not use herbal or other medicines unless your doctor says it's safe. Use creams, lubricants, or moisturizers only as told. General instructions Talk with your doctor about any menopause symptoms and treatment options. Do not douche. Do not use scented: Sprays. Tampons. Soaps. If sex hurts, try using lubricants right before you have sex. Contact a doctor if: You have fluid coming from the vagina that's not normal. You have a smell coming from your vagina. You have new symptoms. Your symptoms don't get better with treatment. Your symptoms get worse. This information is not intended to replace advice given to you by your health care provider. Make sure you discuss any questions you have with your health care provider. Document Revised: 10/12/2022 Document Reviewed: 10/12/2022 Elsevier Patient Education  2024 [employer].

## 2023-08-31 DIAGNOSIS — Z1231 Encounter for screening mammogram for malignant neoplasm of breast: Secondary | ICD-10-CM | POA: Diagnosis not present

## 2023-08-31 LAB — HM MAMMOGRAPHY

## 2023-09-01 ENCOUNTER — Ambulatory Visit: Payer: Self-pay | Admitting: Obstetrics and Gynecology

## 2023-09-01 ENCOUNTER — Encounter: Payer: Self-pay | Admitting: Obstetrics and Gynecology

## 2023-11-01 DIAGNOSIS — H47323 Drusen of optic disc, bilateral: Secondary | ICD-10-CM | POA: Diagnosis not present

## 2023-11-01 DIAGNOSIS — H35372 Puckering of macula, left eye: Secondary | ICD-10-CM | POA: Diagnosis not present

## 2023-11-01 DIAGNOSIS — H43813 Vitreous degeneration, bilateral: Secondary | ICD-10-CM | POA: Diagnosis not present

## 2023-11-23 DIAGNOSIS — E042 Nontoxic multinodular goiter: Secondary | ICD-10-CM | POA: Diagnosis not present

## 2023-11-23 DIAGNOSIS — K219 Gastro-esophageal reflux disease without esophagitis: Secondary | ICD-10-CM | POA: Diagnosis not present

## 2023-11-23 DIAGNOSIS — E782 Mixed hyperlipidemia: Secondary | ICD-10-CM | POA: Diagnosis not present

## 2023-11-23 DIAGNOSIS — R7303 Prediabetes: Secondary | ICD-10-CM | POA: Diagnosis not present

## 2023-11-30 DIAGNOSIS — E042 Nontoxic multinodular goiter: Secondary | ICD-10-CM | POA: Diagnosis not present

## 2023-11-30 DIAGNOSIS — R7303 Prediabetes: Secondary | ICD-10-CM | POA: Diagnosis not present

## 2023-11-30 DIAGNOSIS — Z1331 Encounter for screening for depression: Secondary | ICD-10-CM | POA: Diagnosis not present

## 2023-11-30 DIAGNOSIS — J986 Disorders of diaphragm: Secondary | ICD-10-CM | POA: Diagnosis not present

## 2023-11-30 DIAGNOSIS — E782 Mixed hyperlipidemia: Secondary | ICD-10-CM | POA: Diagnosis not present

## 2023-11-30 DIAGNOSIS — Z0000 Encounter for general adult medical examination without abnormal findings: Secondary | ICD-10-CM | POA: Diagnosis not present

## 2024-03-03 ENCOUNTER — Other Ambulatory Visit: Payer: Self-pay | Admitting: Podiatry

## 2024-03-03 DIAGNOSIS — S92312A Displaced fracture of first metatarsal bone, left foot, initial encounter for closed fracture: Secondary | ICD-10-CM

## 2024-03-03 DIAGNOSIS — S93325A Dislocation of tarsometatarsal joint of left foot, initial encounter: Secondary | ICD-10-CM

## 2024-03-06 ENCOUNTER — Ambulatory Visit
Admission: RE | Admit: 2024-03-06 | Discharge: 2024-03-06 | Disposition: A | Discharge status: Home / Self Care | Source: Ambulatory Visit | Attending: Podiatry | Admitting: Podiatry

## 2024-03-06 DIAGNOSIS — S93325A Dislocation of tarsometatarsal joint of left foot, initial encounter: Secondary | ICD-10-CM | POA: Insufficient documentation

## 2024-03-06 DIAGNOSIS — S92312A Displaced fracture of first metatarsal bone, left foot, initial encounter for closed fracture: Secondary | ICD-10-CM | POA: Diagnosis present

## 2024-03-21 ENCOUNTER — Other Ambulatory Visit: Payer: Self-pay | Admitting: Podiatry

## 2024-03-23 ENCOUNTER — Encounter
Admission: RE | Admit: 2024-03-23 | Discharge: 2024-03-23 | Disposition: A | Discharge status: Home / Self Care | Source: Ambulatory Visit | Attending: Podiatry | Admitting: Podiatry

## 2024-03-23 ENCOUNTER — Other Ambulatory Visit: Payer: Self-pay

## 2024-03-23 VITALS — Ht 65.5 in | Wt 160.0 lb

## 2024-03-23 DIAGNOSIS — Z01810 Encounter for preprocedural cardiovascular examination: Secondary | ICD-10-CM

## 2024-03-23 HISTORY — DX: Unspecified osteoarthritis, unspecified site: M19.90

## 2024-03-23 HISTORY — DX: Other injury of unspecified body region, initial encounter: T14.8XXA

## 2024-03-23 HISTORY — DX: Prediabetes: R73.03

## 2024-03-23 NOTE — Patient Instructions (Addendum)
 Your procedure is scheduled on: Friday 03/24/24 Report to the Registration Desk on the 1st floor of the [facility]. To find out your arrival time, please call [phone] between 1PM - 3PM on: Thursday 03/23/24 If your arrival time is 6:00 am, do not arrive before that time as the [facility] entrance doors do not open until 6:00 am.  REMEMBER: Instructions that are not followed completely may result in serious medical risk, up to and including death; or upon the discretion of your surgeon and anesthesiologist your surgery may need to be rescheduled.  Do not eat food after midnight the night before surgery.  No gum chewing or hard candies.  You may however, drink CLEAR liquids up to 2 hours before you are scheduled to arrive for your surgery. Do not drink anything within 2 hours of your scheduled arrival time.  Clear liquids include: - water  - apple juice without pulp - black coffee or tea (Do NOT add milk or creamers to the coffee or tea) Do NOT drink anything that is not on this list.   In addition, your doctor has ordered for you to drink the provided:  Ensure Pre-Surgery Clear Carbohydrate Drink  Drinking this carbohydrate drink up to two hours before surgery helps to reduce insulin resistance and improve patient outcomes. Please complete drinking 2 hours before scheduled arrival time.  One week prior to surgery: Stop Anti-inflammatories (NSAIDS) such as Advil, Aleve, Ibuprofen, Motrin, Naproxen, Naprosyn and Aspirin  based products such as Excedrin, Goody's Powder, BC Powder. Stop ANY OVER THE COUNTER supplements until after surgery.  You may however, continue to take Tylenol  if needed for pain up until the day of surgery.   Continue taking all of your other prescription medications up until the day of surgery.  ON THE DAY OF SURGERY ONLY TAKE THESE MEDICATIONS WITH SIPS OF WATER:  None    No Alcohol for 24 hours before or after surgery.  No Smoking including  e-cigarettes for 24 hours before surgery.  No chewable tobacco products for at least 6 hours before surgery.  No nicotine patches on the day of surgery.  Do not use any recreational drugs for at least a week (preferably 2 weeks) before your surgery.  Please be advised that the combination of cocaine and anesthesia may have negative outcomes, up to and including death. If you test positive for cocaine, your surgery will be cancelled.  On the morning of surgery brush your teeth with toothpaste and water, you may rinse your mouth with mouthwash if you wish. Do not swallow any toothpaste or mouthwash.  Use CHG Soap or wipes as directed on instruction sheet.  Do not wear jewelry, make-up, hairpins, clips or nail polish.  For welded (permanent) jewelry: bracelets, anklets, waist bands, etc.  Please have this removed prior to surgery.  If it is not removed, there is a chance that hospital personnel will need to cut it off on the day of surgery.  Do not wear lotions, powders, or perfumes.   Do not shave body hair from the neck down 48 hours before surgery.  Contact lenses, hearing aids and dentures may not be worn into surgery.  Do not bring valuables to the hospital. [facility] is not responsible for any missing/lost belongings or valuables.    Notify your doctor if there is any change in your medical condition (cold, fever, infection).  Wear comfortable clothing (specific to your surgery type) to the hospital.  After surgery, you can help prevent  lung complications by doing breathing exercises.  Take deep breaths and cough every 1-2 hours. Your doctor may order a device called an Incentive Spirometer to help you take deep breaths. When coughing or sneezing, hold a pillow firmly against your incision with both hands. This is called splinting. Doing this helps protect your incision. It also decreases belly discomfort.  If you are being admitted to the hospital overnight, leave your  suitcase in the car. After surgery it may be brought to your room.  In case of increased patient census, it may be necessary for you, the patient, to continue your postoperative care in the Same Day Surgery department.  If you are being discharged the day of surgery, you will not be allowed to drive home. You will need a responsible individual to drive you home and stay with you for 24 hours after surgery.   If you are taking public transportation, you will need to have a responsible individual with you.  Please call the Pre-admissions Testing Dept. at [phone] if you have any questions about these instructions.  Surgery Visitation Policy:  Patients having surgery or a procedure may have two visitors.  Children under the age of 38 must have an adult with them who is not the patient.  Inpatient Visitation:    Visiting hours are 7 a.m. to 8 p.m. Up to four visitors are allowed at one time in a patient room. The visitors may rotate out with other people during the day.  One visitor age 81 or older may stay with the patient overnight and must be in the room by 8 p.m.   Merchandiser, Retail to address health-related social needs:  [email or web address]                                                                                                           Preparing for Surgery with CHLORHEXIDINE  GLUCONATE (CHG) Soap  Chlorhexidine  Gluconate (CHG) Soap  o An antiseptic cleaner that kills germs and bonds with the skin to continue killing germs even after washing  o Used for showering the night before surgery and morning of surgery  Before surgery, you can play an important role by reducing the number of germs on your skin.  CHG (Chlorhexidine  gluconate) soap is an antiseptic cleanser which kills germs and bonds with the skin to continue killing germs even after washing.  Please do not use if you have an allergy to CHG or antibacterial soaps. If your skin  becomes reddened/irritated stop using the CHG.  1. Shower the NIGHT BEFORE SURGERY with CHG soap.  2. If you choose to wash your hair, wash your hair first as usual with your normal shampoo.  3. After shampooing, rinse your hair and body thoroughly to remove the shampoo.  4. Use CHG as you would any other liquid soap. You can apply CHG directly to the skin and wash gently with a clean washcloth.  5. Apply the CHG soap to your body only from the neck down. Do not use on open wounds or open  sores. Avoid contact with your eyes, ears, mouth, and genitals (private parts). Wash face and genitals (private parts) with your normal soap.  6. Wash thoroughly, paying special attention to the area where your surgery will be performed.  7. Thoroughly rinse your body with warm water.  8. Do not shower/wash with your normal soap after using and rinsing off the CHG soap.  9. Do not use lotions, oils, etc., after showering with CHG.  10. Pat yourself dry with a clean towel.  11. Wear clean pajamas to bed the night before surgery.  12. Place clean sheets on your bed the night of your shower and do not sleep with pets.  13. Do not apply any deodorants/lotions/powders.  14. Please wear clean clothes to the hospital.  15. Remember to brush your teeth with your regular toothpaste.

## 2024-03-24 ENCOUNTER — Other Ambulatory Visit: Payer: Self-pay

## 2024-03-24 ENCOUNTER — Ambulatory Visit: Admitting: Anesthesiology

## 2024-03-24 ENCOUNTER — Encounter
Admission: RE | Disposition: A | Discharge status: Home / Self Care | Payer: Self-pay | Source: Home / Self Care | Attending: Podiatry

## 2024-03-24 ENCOUNTER — Ambulatory Visit
Admission: RE | Admit: 2024-03-24 | Discharge: 2024-03-24 | Disposition: A | Discharge status: Home / Self Care | Attending: Podiatry | Admitting: Podiatry

## 2024-03-24 ENCOUNTER — Ambulatory Visit

## 2024-03-24 ENCOUNTER — Encounter: Payer: Self-pay | Admitting: Podiatry

## 2024-03-24 DIAGNOSIS — S92242A Displaced fracture of medial cuneiform of left foot, initial encounter for closed fracture: Secondary | ICD-10-CM | POA: Diagnosis present

## 2024-03-24 DIAGNOSIS — Z01810 Encounter for preprocedural cardiovascular examination: Secondary | ICD-10-CM | POA: Diagnosis not present

## 2024-03-24 DIAGNOSIS — S93325A Dislocation of tarsometatarsal joint of left foot, initial encounter: Secondary | ICD-10-CM | POA: Insufficient documentation

## 2024-03-24 DIAGNOSIS — X58XXXA Exposure to other specified factors, initial encounter: Secondary | ICD-10-CM | POA: Diagnosis not present

## 2024-03-24 DIAGNOSIS — M85872 Other specified disorders of bone density and structure, left ankle and foot: Secondary | ICD-10-CM | POA: Diagnosis not present

## 2024-03-24 HISTORY — PX: ARTHRODESIS METATARSAL: SHX6565

## 2024-03-24 HISTORY — PX: OPEN REDUCTION INTERNAL FIXATION (ORIF) FOOT LISFRANC FRACTURE: SHX5990

## 2024-03-24 SURGERY — OPEN REDUCTION INTERNAL FIXATION (ORIF) FOOT LISFRANC FRACTURE
Anesthesia: General | Site: Toe | Laterality: Left

## 2024-03-24 MED ORDER — OXYCODONE HCL 5 MG PO TABS
5.0000 mg | ORAL_TABLET | Freq: Once | ORAL | Status: AC | PRN
  Administered 2024-03-24: 5 mg via ORAL

## 2024-03-24 MED ORDER — ONDANSETRON HCL 4 MG/2ML IJ SOLN: 4.0000 mg | Freq: Four times a day (QID) | INTRAMUSCULAR | Status: DC | PRN

## 2024-03-24 MED ORDER — CHLORHEXIDINE GLUCONATE 0.12 % MT SOLN
OROMUCOSAL | Status: AC
  Filled 2024-03-24: qty 15

## 2024-03-24 MED ORDER — ORAL CARE MOUTH RINSE: 15.0000 mL | Freq: Once | OROMUCOSAL | Status: AC

## 2024-03-24 MED ORDER — DEXAMETHASONE SOD PHOSPHATE PF 10 MG/ML IJ SOLN
INTRAMUSCULAR | Status: DC | PRN
  Administered 2024-03-24: 5 mg via INTRAVENOUS

## 2024-03-24 MED ORDER — ONDANSETRON HCL 4 MG/2ML IJ SOLN
INTRAMUSCULAR | Status: DC | PRN
  Administered 2024-03-24: 4 mg via INTRAVENOUS

## 2024-03-24 MED ORDER — FENTANYL CITRATE (PF) 100 MCG/2ML IJ SOLN
INTRAMUSCULAR | Status: DC | PRN
  Administered 2024-03-24 (×3): 25 ug via INTRAVENOUS
  Administered 2024-03-24: 50 ug via INTRAVENOUS
  Administered 2024-03-24: 25 ug via INTRAVENOUS

## 2024-03-24 MED ORDER — ACETAMINOPHEN 10 MG/ML IV SOLN
INTRAVENOUS | Status: AC
  Filled 2024-03-24: qty 100

## 2024-03-24 MED ORDER — METOCLOPRAMIDE HCL 5 MG/ML IJ SOLN: 5.0000 mg | Freq: Three times a day (TID) | INTRAMUSCULAR | Status: DC | PRN

## 2024-03-24 MED ORDER — BUPIVACAINE HCL (PF) 0.5 % IJ SOLN
INTRAMUSCULAR | Status: DC | PRN
  Administered 2024-03-24: 20 mL via PERINEURAL

## 2024-03-24 MED ORDER — MIDAZOLAM HCL 2 MG/2ML IJ SOLN
INTRAMUSCULAR | Status: AC
  Filled 2024-03-24: qty 2

## 2024-03-24 MED ORDER — OXYCODONE HCL 5 MG PO TABS
ORAL_TABLET | ORAL | Status: AC
  Filled 2024-03-24: qty 1

## 2024-03-24 MED ORDER — BUPIVACAINE HCL (PF) 0.5 % IJ SOLN
INTRAMUSCULAR | Status: AC
  Filled 2024-03-24: qty 20

## 2024-03-24 MED ORDER — DROPERIDOL 2.5 MG/ML IJ SOLN: 0.6250 mg | Freq: Once | INTRAMUSCULAR | Status: DC | PRN

## 2024-03-24 MED ORDER — PROPOFOL 1000 MG/100ML IV EMUL
INTRAVENOUS | Status: AC
  Filled 2024-03-24: qty 100

## 2024-03-24 MED ORDER — PROPOFOL 10 MG/ML IV BOLUS
INTRAVENOUS | Status: AC
  Filled 2024-03-24: qty 20

## 2024-03-24 MED ORDER — PROPOFOL 500 MG/50ML IV EMUL
INTRAVENOUS | Status: DC | PRN
  Administered 2024-03-24: 100 ug/kg/min via INTRAVENOUS

## 2024-03-24 MED ORDER — OXYCODONE-ACETAMINOPHEN 5-325 MG PO TABS: 1.0000 | ORAL_TABLET | Freq: Four times a day (QID) | ORAL | 0 refills | Status: AC | PRN

## 2024-03-24 MED ORDER — CEFAZOLIN SODIUM-DEXTROSE 2-4 GM/100ML-% IV SOLN
2.0000 g | INTRAVENOUS | Status: AC
  Administered 2024-03-24: 2 g via INTRAVENOUS

## 2024-03-24 MED ORDER — BUPIVACAINE LIPOSOME 1.3 % IJ SUSP
INTRAMUSCULAR | Status: DC | PRN
  Administered 2024-03-24: 20 mL via PERINEURAL

## 2024-03-24 MED ORDER — DEXAMETHASONE SOD PHOSPHATE PF 10 MG/ML IJ SOLN
INTRAMUSCULAR | Status: AC
  Filled 2024-03-24: qty 1

## 2024-03-24 MED ORDER — METOCLOPRAMIDE HCL 10 MG PO TABS: 5.0000 mg | ORAL_TABLET | Freq: Three times a day (TID) | ORAL | Status: DC | PRN

## 2024-03-24 MED ORDER — PHENYLEPHRINE 80 MCG/ML (10ML) SYRINGE FOR IV PUSH (FOR BLOOD PRESSURE SUPPORT)
PREFILLED_SYRINGE | INTRAVENOUS | Status: AC
  Filled 2024-03-24: qty 10

## 2024-03-24 MED ORDER — FENTANYL CITRATE (PF) 50 MCG/ML IJ SOSY
PREFILLED_SYRINGE | INTRAMUSCULAR | Status: AC
  Filled 2024-03-24: qty 1

## 2024-03-24 MED ORDER — PROMETHAZINE HCL 12.5 MG PO TABS: 12.5000 mg | ORAL_TABLET | Freq: Four times a day (QID) | ORAL | 0 refills | Status: AC | PRN

## 2024-03-24 MED ORDER — MIDAZOLAM HCL (PF) 2 MG/2ML IJ SOLN
INTRAMUSCULAR | Status: DC | PRN
  Administered 2024-03-24: 2 mg via INTRAVENOUS

## 2024-03-24 MED ORDER — ASPIRIN 325 MG PO TBEC: 325.0000 mg | DELAYED_RELEASE_TABLET | Freq: Two times a day (BID) | ORAL | 11 refills | Status: AC

## 2024-03-24 MED ORDER — LACTATED RINGERS IV SOLN: INTRAVENOUS | Status: DC

## 2024-03-24 MED ORDER — PROPOFOL 10 MG/ML IV BOLUS
INTRAVENOUS | Status: DC | PRN
  Administered 2024-03-24: 20 mg via INTRAVENOUS
  Administered 2024-03-24: 30 mg via INTRAVENOUS

## 2024-03-24 MED ORDER — FENTANYL CITRATE (PF) 100 MCG/2ML IJ SOLN
INTRAMUSCULAR | Status: AC
  Filled 2024-03-24: qty 2

## 2024-03-24 MED ORDER — ACETAMINOPHEN 10 MG/ML IV SOLN: 1000.0000 mg | Freq: Once | INTRAVENOUS | Status: DC | PRN

## 2024-03-24 MED ORDER — LIDOCAINE HCL (PF) 2 % IJ SOLN
INTRAMUSCULAR | Status: AC
  Filled 2024-03-24: qty 5

## 2024-03-24 MED ORDER — BUPIVACAINE LIPOSOME 1.3 % IJ SUSP
INTRAMUSCULAR | Status: AC
  Filled 2024-03-24: qty 20

## 2024-03-24 MED ORDER — CEFAZOLIN SODIUM-DEXTROSE 2-4 GM/100ML-% IV SOLN
INTRAVENOUS | Status: AC
  Filled 2024-03-24: qty 100

## 2024-03-24 MED ORDER — OXYCODONE HCL 5 MG/5ML PO SOLN: 5.0000 mg | Freq: Once | ORAL | Status: AC | PRN

## 2024-03-24 MED ORDER — ONDANSETRON HCL 4 MG/2ML IJ SOLN
INTRAMUSCULAR | Status: AC
  Filled 2024-03-24: qty 2

## 2024-03-24 MED ORDER — ACETAMINOPHEN 10 MG/ML IV SOLN
INTRAVENOUS | Status: DC | PRN
  Administered 2024-03-24: 1000 mg via INTRAVENOUS

## 2024-03-24 MED ORDER — ONDANSETRON HCL 4 MG PO TABS: 4.0000 mg | ORAL_TABLET | Freq: Four times a day (QID) | ORAL | Status: DC | PRN

## 2024-03-24 MED ORDER — CHLORHEXIDINE GLUCONATE 0.12 % MT SOLN
15.0000 mL | Freq: Once | OROMUCOSAL | Status: AC
  Administered 2024-03-24: 15 mL via OROMUCOSAL

## 2024-03-24 MED ORDER — FENTANYL CITRATE (PF) 100 MCG/2ML IJ SOLN: 25.0000 ug | INTRAMUSCULAR | Status: DC | PRN

## 2024-03-24 SURGICAL SUPPLY — 69 items
BENZOIN TINCTURE PRP APPL 2/3 (GAUZE/BANDAGES/DRESSINGS) ×2 IMPLANT
BIT DRILL 2 FENESTRATED (MISCELLANEOUS) IMPLANT
BIT DRILL SOLID 2.0 X 110MM (DRILL) IMPLANT
BLADE SURG 15 STRL LF DISP TIS (BLADE) ×4 IMPLANT
BNDG COHESIVE 4X5 TAN STRL LF (GAUZE/BANDAGES/DRESSINGS) ×2 IMPLANT
BNDG ELASTIC 4X5.8 VLCR NS LF (GAUZE/BANDAGES/DRESSINGS) ×4 IMPLANT
BNDG ESMARCH 4X12 STRL LF (GAUZE/BANDAGES/DRESSINGS) ×2 IMPLANT
BNDG GAUZE DERMACEA FLUFF 4 (GAUZE/BANDAGES/DRESSINGS) ×2 IMPLANT
BNDG STRETCH GAUZE 3IN X12FT (GAUZE/BANDAGES/DRESSINGS) ×2 IMPLANT
BUR 4X45 EGG (BURR) ×2 IMPLANT
COVER PIN YLW 0.028-062 (MISCELLANEOUS) ×4 IMPLANT
CUFF TOURN SGL QUICK 12 (TOURNIQUET CUFF) IMPLANT
CUFF TOURN SGL QUICK 18X4 (TOURNIQUET CUFF) IMPLANT
CUFF TRNQT CYL 24X4X16.5-23 (TOURNIQUET CUFF) IMPLANT
DRAPE C-ARM XRAY 36X54 (DRAPES) ×2 IMPLANT
DRAPE C-ARMOR (DRAPES) ×2 IMPLANT
DRAPE FLUOR MINI C-ARM 54X84 (DRAPES) ×2 IMPLANT
DURAPREP 26ML APPLICATOR (WOUND CARE) ×2 IMPLANT
ELECTRODE REM PT RTRN 9FT ADLT (ELECTROSURGICAL) ×2 IMPLANT
GAUZE SPONGE 4X4 12PLY STRL (GAUZE/BANDAGES/DRESSINGS) ×2 IMPLANT
GAUZE STRETCH 2X75IN STRL (MISCELLANEOUS) ×2 IMPLANT
GAUZE XEROFORM 1X8 LF (GAUZE/BANDAGES/DRESSINGS) ×2 IMPLANT
GLOVE BIO SURGEON STRL SZ7.5 (GLOVE) ×2 IMPLANT
GLOVE BIOGEL PI IND STRL 8 (GLOVE) ×2 IMPLANT
GOWN STRL REUS W/ TWL XL LVL3 (GOWN DISPOSABLE) ×4 IMPLANT
GRAFT TRIN ELITE MED MUSC TRAN (Graft) IMPLANT
KIT TURNOVER KIT A (KITS) ×2 IMPLANT
KWIRE SMOOTH TROCAR 2.0X150 (WIRE) IMPLANT
KWIRE SNGL END 1.2X150 (MISCELLANEOUS) IMPLANT
MANIFOLD NEPTUNE II (INSTRUMENTS) ×4 IMPLANT
NEEDLE FILTER BLUNT 18X1 1/2 (NEEDLE) ×2 IMPLANT
NEEDLE HYPO 22X1.5 SAFETY MO (MISCELLANEOUS) ×2 IMPLANT
NEEDLE HYPO 25X1 1.5 SAFETY (NEEDLE) ×4 IMPLANT
NS IRRIG 500ML POUR BTL (IV SOLUTION) ×2 IMPLANT
PACK EXTREMITY ARMC (MISCELLANEOUS) ×2 IMPLANT
PADDING CAST BLEND 4X4 NS (MISCELLANEOUS) ×2 IMPLANT
PENCIL SMOKE EVACUATOR (MISCELLANEOUS) ×4 IMPLANT
PLATE 4HOLE IMPLANT
PLATE TMT CLOVER LFT 5H (Plate) IMPLANT
SCREW 2.7X22 NONLOCK (Screw) IMPLANT
SCREW LOCK PLATE R3 2.7X11 (Screw) IMPLANT
SCREW LOCK PLATE R3 2.7X14 (Screw) IMPLANT
SCREW LOCK PLATE R3 2.7X15 (Screw) IMPLANT
SCREW LOCK PLATE R3 2.7X17 (Screw) IMPLANT
SCREW LOCK PLATE R3 2.7X20 (Screw) IMPLANT
SCREW LOCK PLATE R3 2.7X22 (Screw) IMPLANT
SCREW LOCK PLATE R3 2.7X24 (Screw) IMPLANT
SCREW LOCK PLATE R3 2.7X26 (Screw) IMPLANT
SCREW LOCK PLT 36X3.5X GRLL (Screw) IMPLANT
SCREW NON LOCK PLATE 2.7X16M (Screw) IMPLANT
SCREW NON LOCKING 2.7X18 (Screw) IMPLANT
SCREW NONLOCKING 3.5X24 (Screw) IMPLANT
SOLN 0.9% NACL POUR BTL 1000ML (IV SOLUTION) ×2 IMPLANT
SOLN STERILE WATER 500 ML (IV SOLUTION) ×2 IMPLANT
SPLINT CAST 1 STEP 4X30 (MISCELLANEOUS) ×2 IMPLANT
SPLINT PLASTER CAST FAST 5X30 (CAST SUPPLIES) ×2 IMPLANT
STIMULATOR BONE GROWTH EMG EXT (ORTHOPEDIC SUPPLIES) IMPLANT
STOCKINETTE M/LG 89821 (MISCELLANEOUS) ×2 IMPLANT
STRAP SAFETY 5IN WIDE (MISCELLANEOUS) ×2 IMPLANT
STRIP CLOSURE SKIN 1/4X4 (GAUZE/BANDAGES/DRESSINGS) ×2 IMPLANT
SUT VIC AB 3-0 SH 27X BRD (SUTURE) ×2 IMPLANT
SUTURE EHLN 3-0 FS-10 30 BLK (SUTURE) ×2 IMPLANT
SUTURE ETHLN 4-0 FS2 18XMF BLK (SUTURE) ×2 IMPLANT
SYR 10ML LL (SYRINGE) ×2 IMPLANT
SYR 50ML LL SCALE MARK (SYRINGE) ×2 IMPLANT
TOWEL OR 17X26 4PK STRL BLUE (TOWEL DISPOSABLE) ×2 IMPLANT
TRAP FLUID SMOKE EVACUATOR (MISCELLANEOUS) ×2 IMPLANT
WIRE OLIVE SMOOTH 1.4MMX60MM (WIRE) IMPLANT
WIRE Z .062 C-WIRE SPADE TIP (WIRE) ×6 IMPLANT

## 2024-03-24 NOTE — Anesthesia Procedure Notes (Signed)
 Anesthesia Regional Block: Popliteal block   Pre-Anesthetic Checklist: , timeout performed,  Correct Patient, Correct Site, Correct Laterality,  Correct Procedure, Correct Position, site marked,  Risks and benefits discussed,  Surgical consent,  Pre-op evaluation,  At surgeon's request and post-op pain management  Laterality: Left  Prep: chloraprep       Needles:  Injection technique: Single-shot  Needle Type: Stimiplex     Needle Length: 9cm  Needle Gauge: 22     Additional Needles:   Procedures:,,,, ultrasound used (permanent image in chart),,    Narrative:  Start time: 03/24/2024 11:48 AM End time: 03/24/2024 11:51 AM Injection made incrementally with aspirations every 5 mL.  Performed by: Personally  Anesthesiologist: Vicci Camellia Glatter, MD  Additional Notes: Patient consented for risk and benefits of nerve block including but not limited to nerve damage, failed block, bleeding and infection.  Patient voiced understanding.  Functioning IV was confirmed and monitors were applied.  Timeout done prior to procedure and prior to any sedation being given to the patient.  Patient confirmed procedure site prior to any sedation given to the patient. Sterile prep,hand hygiene and sterile gloves were used.  Minimal sedation used for procedure.  No paresthesia endorsed by patient during the procedure.  Negative aspiration and negative test dose prior to incremental administration of local anesthetic. The patient tolerated the procedure well with no immediate complications.

## 2024-03-24 NOTE — Op Note (Signed)
 Operative note   Surgeon:Adaliz Dobis [employer]/support/administrative Officer: None    Preop diagnosis: Fracture dislocation left Lisfranc joint complete    Postop diagnosis: Same    Procedure: 1.  Arthrodesis left 1st, 2nd and 3rd tarsometatarsal joints 2.  Open reduction with internal fixation of intercuneiform joint 3.  Open reduction with internal fixation medial cuneiform fracture 4.  Closed reduction left 4th and 5th metatarsal cuboid dislocation 5.  Placement of external bone stimulator left foot 6.  Intraoperative fluoroscopy without assistance of radiologist    EBL: 30 mL    Anesthesia: General With popliteal block patient in the preoperative holding area    Hemostasis: Mid calf tourniquet inflated to 200 mmHg for 120 minutes    Specimen: None    Complications: Osteopenic bone with displaced fracture    Operative indications:Cynthia Webster is an 74 y.o. that presents today for surgical intervention.  The risks/benefits/alternatives/complications have been discussed and consent has been given.    Procedure:  Patient was brought into the OR and placed on the operating table in thesupine position. After anesthesia was obtained theleft lower extremity was prepped and draped in usual sterile fashion.  Attention was initially directed to the dorsal medial first metatarsocuneiform joint where a dorsal medial incision was performed.  Sharp and blunt dissection get down to the periosteum and subperiosteal dissection was performed.  There was no to be a large amount of scar tissue along the first metatarsal cuneiform joint with medial dislocation of the first metatarsal.  Blunt and sharp dissection was used to free up all of the nonviable fibrotic tissue.  At this time it was discovered a fracture within the medial cuneiform.  This was then reduced and stabilized with fine wire fixation for final fixation.  Next incision was placed along the interval between the 2nd and 3rd tarsometatarsal joints.  Full-thickness  flaps were created down to the EDB muscle belly.  This was then transected and the subperiosteal dissection was then performed exposing the entire 2nd and 3rd tarsometatarsal joints as well as the fourth metatarsal cuboid region on the medial side.  Marked displacement of the 2nd and 3rd metatarsal cuneiform joints were noted.  At this time I was able to reduce all areas into a much better realign position.  At this time the joints were then prepped and all fibrotic and nonviable tissue was removed from the joint sites.  All articular cartilage was then removed.  It was noted throughout the entire procedure the bone was quite osteopenic and fragile in many areas.  After removal of all articular cartilage all joints were then prepped with a 2 oh millimeter drill bit.  The sites were then packed with a total of 5 cc of Trinity bone graft from the [place] system.  Initially the tarsometatarsal and medial cuneiform fracture were fixated.  A medial plate was placed overlying the first met cuneiform joint under compression with a combination of 2 7 screws with a 3 5 compression screw within the plate.  The medial cuneiform fracture was fixated and stabilized through the plate at this time.  Attention was directed to the 2nd and 3rd tarsometatarsal joints where all areas were packed with the Trinity bone graft.  Dorsal plates from the [employer] midfoot fusion set were placed overlying the 2nd and 3rd metatarsal tarsal joints with 2.7 millimeter screws under compression.  The tourniquet was released.  Bleeders were Bovie cauterized at this time.  The 4th and 5th met cuboid joints were  then closed reduced after the midfoot fusion was performed and good stability was noted without dislocation.  At this time there was noted to be instability along the intercuneiform joint with slight proximal displacement of the medial cuneiform to the central cuneiform.  This was then reduced and a 3.5 millimeter screw was placed through the  previous plate along the medial cuneiform entering into the middle cuneiform and crossing the lateral intercuneiform site into the lateral cuneiform.  Good stability and realignment was noted with this.  All wounds were finally flushed.  Closure was then performed with a 3-0 Vicryl the deeper and subcutaneous tissue and a 3-0 nylon for the skin.  Due to the osteopenic bone and external bone stimulator was then placed along the dorsal midfoot.  Alignment was noted throughout the procedure with use of fluoroscopy.    Patient tolerated the procedure and anesthesia well.  Was transported from the OR to the PACU with all vital signs stable and vascular status intact. To be discharged per routine protocol.  Will follow up in approximately 1 week in the outpatient clinic.

## 2024-03-24 NOTE — Anesthesia Postprocedure Evaluation (Signed)
"   Anesthesia Post Note  Patient: SAMERA MACY  Procedure(s) Performed: OPEN REDUCTION INTERNAL FIXATION (ORIF) FOOT LISFRANC FRACTURE (Left: Foot) FUSION, JOINT, INVOLVING METATARSAL BONE (Left: Toe)  Patient location during evaluation: PACU Anesthesia Type: General Level of consciousness: awake and alert Pain management: pain level controlled Vital Signs Assessment: post-procedure vital signs reviewed and stable Respiratory status: spontaneous breathing, nonlabored ventilation and respiratory function stable Cardiovascular status: blood pressure returned to baseline and stable Postop Assessment: no apparent nausea or vomiting Anesthetic complications: no   No notable events documented.   Last Vitals:  Vitals:   03/24/24 1652 03/24/24 1713  BP: 114/69 125/74  Pulse: (!) 102 (!) 104  Resp: 20 18  Temp: (!) 36.3 C 36.8 C  SpO2: 96% 96%    Last Pain:  Vitals:   03/24/24 1713  TempSrc: Temporal  PainSc: 0-No pain                 Camellia Merilee Louder      "

## 2024-03-24 NOTE — Anesthesia Procedure Notes (Signed)
 Anesthesia Regional Block: Adductor canal block   Pre-Anesthetic Checklist: , timeout performed,  Correct Patient, Correct Site, Correct Laterality,  Correct Procedure, Correct Position, site marked,  Risks and benefits discussed,  Surgical consent,  Pre-op evaluation,  At surgeon's request and post-op pain management  Laterality: Left  Prep: chloraprep       Needles:  Injection technique: Single-shot  Needle Type: Stimiplex     Needle Length: 9cm  Needle Gauge: 22     Additional Needles:   Procedures:,,,, ultrasound used (permanent image in chart),,    Narrative:  Start time: 03/24/2024 11:45 AM End time: 03/24/2024 11:48 AM Injection made incrementally with aspirations every 5 mL.  Performed by: Personally  Anesthesiologist: Vicci Camellia Glatter, MD  Additional Notes: Patient consented for risk and benefits of nerve block including but not limited to nerve damage, failed block, bleeding and infection.  Patient voiced understanding.  Functioning IV was confirmed and monitors were applied.  Timeout done prior to procedure and prior to any sedation being given to the patient.  Patient confirmed procedure site prior to any sedation given to the patient. Sterile prep,hand hygiene and sterile gloves were used.  Minimal sedation used for procedure.  No paresthesia endorsed by patient during the procedure.  Negative aspiration and negative test dose prior to incremental administration of local anesthetic. The patient tolerated the procedure well with no immediate complications.

## 2024-03-24 NOTE — Transfer of Care (Signed)
 Immediate Anesthesia Transfer of Care Note  Patient: Cynthia Webster  Procedure(s) Performed: OPEN REDUCTION INTERNAL FIXATION (ORIF) FOOT LISFRANC FRACTURE (Left: Foot) FUSION, JOINT, INVOLVING METATARSAL BONE (Left: Toe)  Patient Location: PACU  Anesthesia Type:General  Level of Consciousness: drowsy  Airway & Oxygen Therapy: Patient Spontanous Breathing and Patient connected to face mask oxygen  Post-op Assessment: Report given to RN and Post -op Vital signs reviewed and stable  Post vital signs: Reviewed and stable  Last Vitals:  Vitals Value Taken Time  BP 117/74 03/24/24 16:00  Temp    Pulse 94 03/24/24 16:01  Resp 21 03/24/24 16:01  SpO2 100 % 03/24/24 16:01  Vitals shown include unfiled device data.  Last Pain:  Vitals:   03/24/24 1113  TempSrc: Temporal  PainSc: 0-No pain         Complications: No notable events documented.

## 2024-03-24 NOTE — Anesthesia Preprocedure Evaluation (Addendum)
 "                                  Anesthesia Evaluation  Patient identified by MRN, date of birth, ID band Patient awake    Reviewed: Allergy & Precautions, H&P , NPO status , Patient's Chart, lab work & pertinent test results  Airway Mallampati: II  TM Distance: >3 FB Neck ROM: full    Dental no notable dental hx.    Pulmonary  Pt reports elevated hemidiaphram with sob   Pulmonary exam normal        Cardiovascular negative cardio ROS Normal cardiovascular exam     Neuro/Psych negative neurological ROS  negative psych ROS   GI/Hepatic negative GI ROS, Neg liver ROS,,,  Endo/Other  negative endocrine ROS    Renal/GU negative Renal ROS  negative genitourinary   Musculoskeletal  (+) Arthritis ,    Abdominal Normal abdominal exam  (+)   Peds  Hematology negative hematology ROS (+)   Anesthesia Other Findings Past Medical History: No date: Arthritis No date: Cataract of both eyes 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), 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), thoracic No date: Fracture     Comment:  Lisfranc fracture of right foot No date: Gastritis No date: Hypercholesteremia No date: Osteopenia No date: Pelvic prolapse No date: Personal history of colonic polyps No date: Pre-diabetes No date: Pulmonary nodule No date: Rectocele  Past Surgical History: No date: BIOPSY THYROID  No date: BREAST SURGERY; Right 08/23/2018: CATARACT EXTRACTION W/PHACO; Right     Comment:  Procedure: CATARACT EXTRACTION PHACO AND INTRAOCULAR               LENS PLACEMENT (IOC)  RIGHT;  Surgeon: Jaye Fallow,              MD;  Location: [facility];  Service:               Ophthalmology;  Laterality: Right; No date: CHOLECYSTECTOMY No date: COLON SURGERY 12/03/2017: COLONOSCOPY WITH PROPOFOL ; N/A     Comment:  Procedure: COLONOSCOPY WITH PROPOFOL ;  Surgeon: Viktoria Lamar DASEN, MD;  Location: [facility] ENDOSCOPY;  Service:                Endoscopy;  Laterality: N/A; 02/17/2019: COLONOSCOPY WITH PROPOFOL ; N/A     Comment:  Procedure: COLONOSCOPY WITH PROPOFOL ;  Surgeon: Dessa Reyes ORN, MD;  Location: [facility] ENDOSCOPY;  Service:               Endoscopy;  Laterality: N/A; 07/14/2022: COLONOSCOPY WITH PROPOFOL ; N/A     Comment:  Procedure: COLONOSCOPY WITH PROPOFOL ;  Surgeon:               Maryruth Ole DASEN, MD;  Location: [facility] ENDOSCOPY;                Service: Endoscopy;  Laterality: N/A; No date: EYE SURGERY 11/06/2020: RADIOACTIVE SEED GUIDED EXCISIONAL BREAST BIOPSY; Right     Comment:  Procedure: RIGHT BREAST RADIOACTIVE SEED GUIDED               EXCISIONAL BREAST BIOPSY;  Surgeon: Aron Shoulders, MD;                Location:  SURGERY CENTER;  Service: General;  Laterality: Right; No date: TONSILLECTOMY     Reproductive/Obstetrics negative OB ROS                              Anesthesia Physical Anesthesia Plan  ASA: 2  Anesthesia Plan: General   Post-op Pain Management: Regional block*   Induction: Intravenous  PONV Risk Score and Plan: Propofol  infusion and TIVA  Airway Management Planned: Natural Airway  Additional Equipment:   Intra-op Plan:   Post-operative Plan:   Informed Consent: I have reviewed the patients History and Physical, chart, labs and discussed the procedure including the risks, benefits and alternatives for the proposed anesthesia with the patient or authorized representative who has indicated his/her understanding and acceptance.     Dental Advisory Given  Plan Discussed with: CRNA and Surgeon  Anesthesia Plan Comments:          Anesthesia Quick Evaluation  "

## 2024-03-24 NOTE — H&P (Signed)
 HISTORY AND PHYSICAL INTERVAL NOTE:  03/24/2024  11:41 AM  Cynthia Webster  has presented today for surgery, with the diagnosis of Lisfranc's dislocation, left, subsequent encounter S93.325D.  The various methods of treatment have been discussed with the patient.  No guarantees were given.  After consideration of risks, benefits and other options for treatment, the patient has consented to surgery.  I have reviewed the patients chart and labs.     A history and physical examination was performed in my office.  The patient was reexamined.  There have been no changes to this history and physical examination.  Ashley Soulier A

## 2024-03-24 NOTE — Discharge Instructions (Addendum)
 [facility]  POST OPERATIVE INSTRUCTIONS FOR DR. Ether Griffins AND DR. BAKER [facility] PODIATRY DEPARTMENT   Take your medication as prescribed.  Pain medication should be taken only as needed.  Keep the dressing clean, dry and intact.  Keep your foot elevated above the heart level for the first 48 hours.  We have instructed you to be non-weight bearing.  Always wear your post-op shoe when walking.  Always use your crutches if you are to be non-weight bearing.  Do not take a shower. Baths are permissible as long as the foot is kept out of the water.   Every hour you are awake:  Bend your knee 15 times.   Call [facility] [phone]) if any of the following problems occur: You develop a temperature or fever. The bandage becomes saturated with blood. Medication does not stop your pain. Injury of the foot occurs. Any symptoms of infection including redness, odor, or red streaks running from wound.   Information for Discharge Teaching: EXPAREL (bupivacaine liposome injectable suspension)   Pain relief is important to your recovery. The goal is to control your pain so you can move easier and return to your normal activities as soon as possible after your procedure. Your physician may use several types of medicines to manage pain, swelling, and more.  Your surgeon or anesthesiologist gave you EXPAREL(bupivacaine) to help control your pain after surgery.  EXPAREL is a local anesthetic designed to release slowly over an extended period of time to provide pain relief by numbing the tissue around the surgical site. EXPAREL is designed to release pain medication over time and can control pain for up to 72 hours. Depending on how you respond to EXPAREL, you may require less pain medication during your recovery. EXPAREL can help reduce or eliminate the need for opioids during the first few days after surgery when pain relief is needed the  most. EXPAREL is not an opioid and is not addictive. It does not cause sleepiness or sedation.   Important! A teal colored band has been placed on your arm with the date, time and amount of EXPAREL you have received. Please leave this armband in place for the full 96 hours following administration, and then you may remove the band. If you return to the hospital for any reason within 96 hours following the administration of EXPAREL, the armband provides important information that your health care providers to know, and alerts them that you have received this anesthetic.    Possible side effects of EXPAREL: Temporary loss of sensation or ability to move in the area where medication was injected. Nausea, vomiting, constipation Rarely, numbness and tingling in your mouth or lips, lightheadedness, or anxiety may occur. Call your doctor right away if you think you may be experiencing any of these sensations, or if you have other questions regarding possible side effects.  Follow all other discharge instructions given to you by your surgeon or nurse. Eat a healthy diet and drink plenty of water or other fluids.

## 2024-03-27 ENCOUNTER — Encounter: Payer: Self-pay | Admitting: Podiatry
